# Patient Record
Sex: Male | Born: 1980 | Race: White | Hispanic: No | Marital: Single | Smoking: Current every day smoker
Health system: Southern US, Community
[De-identification: ages and names within clinical notes are randomized; demographics above are authoritative.]

## PROBLEM LIST (undated history)

## (undated) DIAGNOSIS — F909 Attention-deficit hyperactivity disorder, unspecified type: Secondary | ICD-10-CM

## (undated) DIAGNOSIS — R45851 Suicidal ideations: Secondary | ICD-10-CM

## (undated) DIAGNOSIS — F419 Anxiety disorder, unspecified: Secondary | ICD-10-CM

## (undated) HISTORY — DX: Suicidal ideations: R45.851

---

## 2002-07-26 ENCOUNTER — Emergency Department (HOSPITAL_COMMUNITY): Admission: EM | Admit: 2002-07-26 | Discharge: 2002-07-26 | Payer: Self-pay | Admitting: Emergency Medicine

## 2002-08-25 ENCOUNTER — Emergency Department (HOSPITAL_COMMUNITY): Admission: EM | Admit: 2002-08-25 | Discharge: 2002-08-25 | Payer: Self-pay | Admitting: *Deleted

## 2002-08-27 ENCOUNTER — Emergency Department (HOSPITAL_COMMUNITY): Admission: EM | Admit: 2002-08-27 | Discharge: 2002-08-27 | Payer: Self-pay | Admitting: Emergency Medicine

## 2002-10-02 ENCOUNTER — Emergency Department (HOSPITAL_COMMUNITY): Admission: EM | Admit: 2002-10-02 | Discharge: 2002-10-02 | Payer: Self-pay | Admitting: Emergency Medicine

## 2003-05-17 ENCOUNTER — Emergency Department (HOSPITAL_COMMUNITY): Admission: EM | Admit: 2003-05-17 | Discharge: 2003-05-17 | Payer: Self-pay

## 2004-06-10 ENCOUNTER — Emergency Department (HOSPITAL_COMMUNITY): Admission: EM | Admit: 2004-06-10 | Discharge: 2004-06-10 | Payer: Self-pay | Admitting: Emergency Medicine

## 2004-12-27 HISTORY — PX: SURGERY OF LIP: SUR1315

## 2004-12-28 ENCOUNTER — Emergency Department (HOSPITAL_COMMUNITY): Admission: EM | Admit: 2004-12-28 | Discharge: 2004-12-28 | Payer: Self-pay | Admitting: Emergency Medicine

## 2009-08-29 ENCOUNTER — Emergency Department (HOSPITAL_COMMUNITY): Admission: EM | Admit: 2009-08-29 | Discharge: 2009-08-29 | Payer: Self-pay | Admitting: Family Medicine

## 2010-10-29 ENCOUNTER — Emergency Department (HOSPITAL_COMMUNITY): Admission: EM | Admit: 2010-10-29 | Discharge: 2010-10-29 | Payer: Self-pay | Admitting: Emergency Medicine

## 2011-08-29 ENCOUNTER — Inpatient Hospital Stay (INDEPENDENT_AMBULATORY_CARE_PROVIDER_SITE_OTHER)
Admission: RE | Admit: 2011-08-29 | Discharge: 2011-08-29 | Disposition: A | Discharge status: Home / Self Care | Payer: Self-pay | Source: Ambulatory Visit | Attending: Emergency Medicine | Admitting: Emergency Medicine

## 2011-08-29 DIAGNOSIS — M545 Low back pain, unspecified: Secondary | ICD-10-CM

## 2011-09-08 ENCOUNTER — Inpatient Hospital Stay (INDEPENDENT_AMBULATORY_CARE_PROVIDER_SITE_OTHER)
Admission: RE | Admit: 2011-09-08 | Discharge: 2011-09-08 | Disposition: A | Discharge status: Home / Self Care | Payer: Self-pay | Source: Ambulatory Visit | Attending: Family Medicine | Admitting: Family Medicine

## 2011-09-08 DIAGNOSIS — S335XXA Sprain of ligaments of lumbar spine, initial encounter: Secondary | ICD-10-CM

## 2012-08-10 ENCOUNTER — Encounter (HOSPITAL_COMMUNITY): Payer: Self-pay | Admitting: *Deleted

## 2012-08-10 ENCOUNTER — Emergency Department (INDEPENDENT_AMBULATORY_CARE_PROVIDER_SITE_OTHER)
Admission: EM | Admit: 2012-08-10 | Discharge: 2012-08-10 | Disposition: A | Discharge status: Home / Self Care | Payer: Self-pay | Source: Home / Self Care | Attending: Emergency Medicine | Admitting: Emergency Medicine

## 2012-08-10 DIAGNOSIS — L0291 Cutaneous abscess, unspecified: Secondary | ICD-10-CM

## 2012-08-10 DIAGNOSIS — L0390 Cellulitis, unspecified: Secondary | ICD-10-CM

## 2012-08-10 MED ORDER — HYDROCODONE-ACETAMINOPHEN 5-325 MG PO TABS: ORAL_TABLET | ORAL | Status: AC

## 2012-08-10 MED ORDER — TRAMADOL HCL 50 MG PO TABS: 100.0000 mg | ORAL_TABLET | Freq: Three times a day (TID) | ORAL | Status: DC | PRN

## 2012-08-10 MED ORDER — SULFAMETHOXAZOLE-TMP DS 800-160 MG PO TABS: 2.0000 | ORAL_TABLET | Freq: Two times a day (BID) | ORAL | Status: AC

## 2012-08-10 NOTE — ED Notes (Signed)
Pt reports large boil on inner left thigh that is draining.

## 2012-08-10 NOTE — ED Provider Notes (Signed)
Chief Complaint  Patient presents with  . Abscess    History of Present Illness:    The patient is a 31 year old male who's had a 3 to four-day history of an abscess in his left groin. Today drained a little bit of serous fluid. He's had these before but none recently. No prior history of MRSA or diabetes. He denies fever or chills.  Review of Systems:  Other than noted above, the patient denies any of the following symptoms: Systemic:  No fever, chills or sweats. Skin:  No rash or itching.  PMFSH:  Past medical history, family history, social history, meds, and allergies were reviewed.  No history of diabetes or prior history of abscesses or MRSA.  Physical Exam:   Vital signs:  BP 130/81  Pulse 80  Temp 98.5 F (36.9 C) (Oral)  Resp 18  SpO2 98% Skin:  There was a 3 x 4 cm, indurated, raised, red, tender, a ovoid abscess in the left groin. There was a small central sinus and this was draining serous fluid.  Skin exam was otherwise normal.  No rash. Ext:  Distal pulses were full, patient has full ROM of all joints.    Procedure:  Verbal informed consent was obtained.  The patient was informed of the risks and benefits of the procedure and understands and accepts.  Identity of the patient was verified verbally and by wristband.   The abscess area described above was prepped with Betadine and alcohol and anesthetized with 5 mL of 2% Xylocaine with epinephrine.  Using a #11 scalpel blade, a singe straight incision was made into the area of fluctulence, yielding a small amount of prurulent drainage.  Routine cultures were obtained.  Blunt dissection was used to break up loculations and the resulting wound cavity was packed with 1/4 inch Iodoform gauze.  A sterile pressure dressing was applied.  Assessment:  The encounter diagnosis was Abscess.  Plan:   1.  The following meds were prescribed:   New Prescriptions   HYDROCODONE-ACETAMINOPHEN (NORCO/VICODIN) 5-325 MG PER TABLET    1 to 2  tabs every 4 to 6 hours as needed for pain.   SULFAMETHOXAZOLE-TRIMETHOPRIM (BACTRIM DS) 800-160 MG PER TABLET    Take 2 tablets by mouth 2 (two) times daily.   2.  The patient was instructed in symptomatic care and handouts were given. 3.  The patient was instructed to leave the dressing in place and return again in 48 hours for packing removal.   Reuben Likes, MD 08/10/12 2150

## 2012-08-14 ENCOUNTER — Telehealth (HOSPITAL_COMMUNITY): Payer: Self-pay | Admitting: *Deleted

## 2012-08-14 LAB — CULTURE, ROUTINE-ABSCESS
Gram Stain: NONE SEEN
Special Requests: NORMAL

## 2012-08-14 NOTE — ED Notes (Signed)
Pt. called for her lab results.  Pt. verified x 2 and given results. I told pt. She was adequately treated with the Bactrim DS.  I told her it was not MRSA.  She asked what that was and I explained.   Pt. said she did not fill her Rx. yet.  She asked if she needs to fill it? I said yes, staph can be a serious infection if not treated.  She asked if she needed to come back and I said only if its getting worse or not better when she finished her medication.  She said it is getting better. Vassie Moselle 08/14/2012

## 2013-02-01 ENCOUNTER — Emergency Department (HOSPITAL_COMMUNITY)
Admission: EM | Admit: 2013-02-01 | Discharge: 2013-02-01 | Disposition: A | Discharge status: Home / Self Care | Payer: Self-pay | Attending: Emergency Medicine | Admitting: Emergency Medicine

## 2013-02-01 ENCOUNTER — Encounter (HOSPITAL_COMMUNITY): Payer: Self-pay | Admitting: Cardiology

## 2013-02-01 ENCOUNTER — Emergency Department (HOSPITAL_COMMUNITY): Payer: Self-pay

## 2013-02-01 DIAGNOSIS — N281 Cyst of kidney, acquired: Secondary | ICD-10-CM | POA: Insufficient documentation

## 2013-02-01 DIAGNOSIS — R1900 Intra-abdominal and pelvic swelling, mass and lump, unspecified site: Secondary | ICD-10-CM

## 2013-02-01 DIAGNOSIS — F17200 Nicotine dependence, unspecified, uncomplicated: Secondary | ICD-10-CM | POA: Insufficient documentation

## 2013-02-01 DIAGNOSIS — R1909 Other intra-abdominal and pelvic swelling, mass and lump: Secondary | ICD-10-CM | POA: Insufficient documentation

## 2013-02-01 DIAGNOSIS — R209 Unspecified disturbances of skin sensation: Secondary | ICD-10-CM | POA: Insufficient documentation

## 2013-02-01 LAB — CBC WITH DIFFERENTIAL/PLATELET
Hemoglobin: 13.4 g/dL (ref 13.0–17.0)
Lymphocytes Relative: 50 % — ABNORMAL HIGH (ref 12–46)
Lymphs Abs: 3.4 10*3/uL (ref 0.7–4.0)
MCH: 29.5 pg (ref 26.0–34.0)
Monocytes Relative: 5 % (ref 3–12)
Neutrophils Relative %: 44 % (ref 43–77)
Platelets: 265 10*3/uL (ref 150–400)
RBC: 4.55 MIL/uL (ref 4.22–5.81)
WBC: 6.8 10*3/uL (ref 4.0–10.5)

## 2013-02-01 LAB — POCT I-STAT, CHEM 8
BUN: 15 mg/dL (ref 6–23)
Chloride: 107 mEq/L (ref 96–112)
Creatinine, Ser: 0.9 mg/dL (ref 0.50–1.35)
Glucose, Bld: 86 mg/dL (ref 70–99)
Potassium: 4.2 mEq/L (ref 3.5–5.1)
Sodium: 141 mEq/L (ref 135–145)

## 2013-02-01 MED ORDER — IOHEXOL 300 MG/ML  SOLN
100.0000 mL | Freq: Once | INTRAMUSCULAR | Status: AC | PRN
  Administered 2013-02-01: 100 mL via INTRAVENOUS

## 2013-02-01 MED ORDER — MORPHINE SULFATE 4 MG/ML IJ SOLN
4.0000 mg | Freq: Once | INTRAMUSCULAR | Status: AC
  Administered 2013-02-01: 4 mg via INTRAVENOUS
  Filled 2013-02-01: qty 1

## 2013-02-01 MED ORDER — OXYCODONE-ACETAMINOPHEN 5-325 MG PO TABS: 2.0000 | ORAL_TABLET | Freq: Once | ORAL | Status: AC

## 2013-02-01 MED ORDER — NAPROXEN 500 MG PO TABS: 500.0000 mg | ORAL_TABLET | Freq: Two times a day (BID) | ORAL | Status: DC

## 2013-02-01 MED ORDER — OXYCODONE-ACETAMINOPHEN 5-325 MG PO TABS: 1.0000 | ORAL_TABLET | ORAL | Status: DC | PRN

## 2013-02-01 MED ORDER — OXYCODONE-ACETAMINOPHEN 5-325 MG PO TABS
2.0000 | ORAL_TABLET | Freq: Once | ORAL | Status: AC
  Administered 2013-02-01: 2 via ORAL
  Filled 2013-02-01: qty 2

## 2013-02-01 MED ORDER — IOHEXOL 300 MG/ML  SOLN
25.0000 mL | INTRAMUSCULAR | Status: AC
  Administered 2013-02-01 (×2): 25 mL via ORAL

## 2013-02-01 NOTE — ED Notes (Signed)
CT notified of pt finishing contrast. 

## 2013-02-01 NOTE — ED Notes (Signed)
Pt returned from CT °

## 2013-02-01 NOTE — ED Notes (Signed)
Pt reports lumps to the left side of his back that having been present for the past couple of years but now is having pain and numbness down into his left side. Never been seen for this pain prior to today.

## 2013-02-01 NOTE — ED Notes (Signed)
Pt given discharge paperwork; pt verbalized understanding of discharge and f/u; no additional questions by pt; e-signature obtained; 

## 2013-02-01 NOTE — ED Provider Notes (Signed)
History     CSN: [id number]  Arrival date & time 02/01/13  1238   First MD Initiated Contact with Patient 02/01/13 1420      Chief Complaint  Patient presents with  . Flank Pain    (Consider location/radiation/quality/duration/timing/severity/associated sxs/prior treatment) HPI Comments: 32 year old male presents with a left-sided upper flank mass which has been present for approximately 10 years. He states that in the past he has just dealt with it, he has not been evaluated for the problem but it seems to have gotten worse in the last couple of days. He admits to having a greater than 60 pound weight loss over the last year which is unintentional but no other significant complaints.  When asked the patient admits that he has been having intermittent numbness in his genitalia as well as some difficulty sensation of his legs. He also complains of numbness of his penis when having sex. He denies a history of tuberculosis though he was incarcerated in the are of 2006. He denies night sweats or fevers  Patient is a 32 y.o. male presenting with flank pain. The history is provided by the patient.  Flank Pain    History reviewed. No pertinent past medical history.  History reviewed. No pertinent past surgical history.  History reviewed. No pertinent family history.  History  Substance Use Topics  . Smoking status: Current Every Day Smoker -- 0.5 packs/day    Types: Cigarettes  . Smokeless tobacco: Not on file  . Alcohol Use: No      Review of Systems  Genitourinary: Positive for flank pain.  All other systems reviewed and are negative.    Allergies  Review of patient's allergies indicates no known allergies.  Home Medications   Current Outpatient Rx  Name  Route  Sig  Dispense  Refill  . IBUPROFEN 200 MG PO TABS   Oral   Take 400 mg by mouth every 8 (eight) hours as needed. For pain         . NAPROXEN 500 MG PO TABS   Oral   Take 1 tablet (500 mg total) by mouth 2  (two) times daily with a meal.   30 tablet   0   . OXYCODONE-ACETAMINOPHEN 5-325 MG PO TABS   Oral   Take 1 tablet by mouth every 4 (four) hours as needed for pain.   20 tablet   0     BP 130/71  Pulse 66  Temp 98.3 F (36.8 C) (Oral)  Resp 18  SpO2 100%  Physical Exam  Nursing note and vitals reviewed. Constitutional: He appears well-developed and well-nourished. No distress.  HENT:  Head: Normocephalic and atraumatic.  Mouth/Throat: Oropharynx is clear and moist. No oropharyngeal exudate.  Eyes: Conjunctivae normal and EOM are normal. Pupils are equal, round, and reactive to light. Right eye exhibits no discharge. Left eye exhibits no discharge. No scleral icterus.  Neck: Normal range of motion. Neck supple. No JVD present. No thyromegaly present.  Cardiovascular: Normal rate, regular rhythm, normal heart sounds and intact distal pulses.  Exam reveals no gallop and no friction rub.   No murmur heard. Pulmonary/Chest: Effort normal and breath sounds normal. No respiratory distress. He has no wheezes. He has no rales.  Abdominal: Soft. Bowel sounds are normal. He exhibits no distension and no mass. There is no tenderness.  Musculoskeletal: Normal range of motion. He exhibits no edema and no tenderness.  Lymphadenopathy:    He has no cervical adenopathy.  Neurological: He  is alert. Coordination normal.  Skin: Skin is warm and dry. No rash noted. No erythema.       There is tenderness to palpation with a soft tissue mass in the left side and flank which is approximately 5 cm x 3 cm in a football shape. There is no erythema, no redness, no drainage, no fluctuance, no induration, it is tender to palpation.  It is mobile and rubbery in texture  Psychiatric: He has a normal mood and affect. His behavior is normal.    ED Course  Procedures (including critical care time)  Labs Reviewed  CBC WITH DIFFERENTIAL - Abnormal; Notable for the following:    HCT 38.5 (*)      Lymphocytes Relative 50 (*)     All other components within normal limits  POCT I-STAT, CHEM 8 - Abnormal; Notable for the following:    Calcium, Ion 1.24 (*)     All other components within normal limits   Ct Abdomen Pelvis W Contrast  02/01/2013  *RADIOLOGY REPORT*  Clinical Data: Left flank pain, weight loss  CT ABDOMEN AND PELVIS WITH CONTRAST  Technique:  Multidetector CT imaging of the abdomen and pelvis was performed following the standard protocol during bolus administration of intravenous contrast.  Contrast: OMNIPAQUE IOHEXOL 300 MG/ML  SOLN  Comparison: None.  Findings: Lung bases are clear.  Liver, pancreas, and adrenal glands are within normal limits.  Spleen is normal in size, measuring 9.9 cm in craniocaudal dimension.  Gallbladder is underdistended.  No intrahepatic or extrahepatic ductal dilatation.  12 x 12 mm left upper pole renal cyst (series 6/image 27).  12 x 9 mm posterior interpolar left renal cyst (series 6/image 38). Additional 8 mm suspected anterior interpolar left renal cyst (series 6/image 41).  Right kidney is within normal limits.  No hydronephrosis.  No evidence of bowel obstruction.  Normal appendix.  No evidence of abdominal aortic aneurysm.  Retroaortic left renal vein.  No suspicious abdominopelvic lymphadenopathy.  Prostate is unremarkable.  Bladder is within normal limits.  Tiny fat-containing right inguinal hernia.  Visualized osseous structures are within normal limits.  IMPRESSION: Small left renal cysts measuring up to 12 mm, benign.  Otherwise normal CT abdomen/pelvis.   Original Report Authenticated By: Charline Bills, M.D.      1. Flank mass   2. Renal cyst       MDM  The patient has a stable exam, he has been able to ambulate without difficulty, vital signs remain normal, laboratory data is normal including a CBC and a metabolic panel. The CT scan was read as negative for any acute findings including no masses. He does have some renal cysts of  which the patient was made aware and will followup. Followup phone numbers given including general surgery   Discharge Prescriptions include:   Naprosyn  Percocet        Vida Roller, MD 02/01/13 [phone]

## 2013-02-01 NOTE — ED Notes (Signed)
Pt reports he has had lumps present for about 13 years; states pain started about 8 years ago and comes and goes; states he has been having some shortness of breath but none currently;

## 2013-05-07 ENCOUNTER — Encounter (HOSPITAL_COMMUNITY): Payer: Self-pay | Admitting: *Deleted

## 2013-05-07 ENCOUNTER — Emergency Department (HOSPITAL_COMMUNITY): Payer: Self-pay

## 2013-05-07 ENCOUNTER — Emergency Department (HOSPITAL_COMMUNITY)
Admission: EM | Admit: 2013-05-07 | Discharge: 2013-05-07 | Disposition: A | Discharge status: Home / Self Care | Payer: Self-pay | Attending: Emergency Medicine | Admitting: Emergency Medicine

## 2013-05-07 DIAGNOSIS — F17200 Nicotine dependence, unspecified, uncomplicated: Secondary | ICD-10-CM | POA: Insufficient documentation

## 2013-05-07 DIAGNOSIS — D179 Benign lipomatous neoplasm, unspecified: Secondary | ICD-10-CM | POA: Insufficient documentation

## 2013-05-07 LAB — URINALYSIS, ROUTINE W REFLEX MICROSCOPIC
Bilirubin Urine: NEGATIVE
Ketones, ur: NEGATIVE mg/dL
Nitrite: NEGATIVE
Specific Gravity, Urine: 1.019 (ref 1.005–1.030)
Urobilinogen, UA: 0.2 mg/dL (ref 0.0–1.0)

## 2013-05-07 LAB — CBC WITH DIFFERENTIAL/PLATELET
Eosinophils Absolute: 0.1 10*3/uL (ref 0.0–0.7)
Eosinophils Relative: 1 % (ref 0–5)
HCT: 42.2 % (ref 39.0–52.0)
Hemoglobin: 14.8 g/dL (ref 13.0–17.0)
Lymphs Abs: 2.2 10*3/uL (ref 0.7–4.0)
MCH: 29.7 pg (ref 26.0–34.0)
MCHC: 35.1 g/dL (ref 30.0–36.0)
MCV: 84.6 fL (ref 78.0–100.0)
Monocytes Absolute: 0.4 10*3/uL (ref 0.1–1.0)
Monocytes Relative: 5 % (ref 3–12)
Neutrophils Relative %: 64 % (ref 43–77)
RBC: 4.99 MIL/uL (ref 4.22–5.81)

## 2013-05-07 LAB — COMPREHENSIVE METABOLIC PANEL
Alkaline Phosphatase: 77 U/L (ref 39–117)
BUN: 18 mg/dL (ref 6–23)
Creatinine, Ser: 0.75 mg/dL (ref 0.50–1.35)
GFR calc Af Amer: >90 mL/min (ref >90)
Glucose, Bld: 107 mg/dL — ABNORMAL HIGH (ref 70–99)
Potassium: 4 mEq/L (ref 3.5–5.1)
Total Protein: 8.3 g/dL (ref 6.0–8.3)

## 2013-05-07 LAB — LIPASE, BLOOD: Lipase: 39 U/L (ref 11–59)

## 2013-05-07 MED ORDER — OXYCODONE-ACETAMINOPHEN 5-325 MG PO TABS: 2.0000 | ORAL_TABLET | ORAL | Status: DC | PRN

## 2013-05-07 MED ORDER — OXYCODONE-ACETAMINOPHEN 5-325 MG PO TABS
1.0000 | ORAL_TABLET | Freq: Once | ORAL | Status: AC
  Administered 2013-05-07: 1 via ORAL
  Filled 2013-05-07: qty 1

## 2013-05-07 MED ORDER — IBUPROFEN 800 MG PO TABS
800.0000 mg | ORAL_TABLET | Freq: Once | ORAL | Status: AC
  Administered 2013-05-07: 800 mg via ORAL
  Filled 2013-05-07: qty 1

## 2013-05-07 MED ORDER — IBUPROFEN 800 MG PO TABS: 800.0000 mg | ORAL_TABLET | Freq: Three times a day (TID) | ORAL | Status: DC

## 2013-05-07 NOTE — ED Provider Notes (Signed)
History     CSN: [id number]  Arrival date & time 05/07/13  8295   First MD Initiated Contact with Patient 05/07/13 1052      Chief Complaint  Patient presents with  . Back Pain    (Consider location/radiation/quality/duration/timing/severity/associated sxs/prior treatment) The history is provided by the patient.  Candelario MANLEY FASON is a 32 y.o. male here presenting with possible mass. She noticed a mobile mass for several years on the left side of his chest. For the last several months he noticed another lump along the left side of his chest. Denies any chest pain or shortness of breath. Over the last few days, his pain is worse when he coughs. It sometimes radiate around to his abdomen. Vomited several days ago that resolved. No urinary symptoms or constipation or diarrhea. He said he was told to follow up with a surgeon but he lost the number. He still smokes but is not loosing weight.    History reviewed. No pertinent past medical history.  History reviewed. No pertinent past surgical history.  No family history on file.  History  Substance Use Topics  . Smoking status: Current Every Day Smoker -- 0.50 packs/day    Types: Cigarettes  . Smokeless tobacco: Not on file  . Alcohol Use: No      Review of Systems  Gastrointestinal: Positive for abdominal pain.  Musculoskeletal: Positive for back pain.  All other systems reviewed and are negative.    Allergies  Review of patient's allergies indicates no known allergies.  Home Medications   Current Outpatient Rx  Name  Route  Sig  Dispense  Refill  . ibuprofen (ADVIL,MOTRIN) 200 MG tablet   Oral   Take 400 mg by mouth every 8 (eight) hours as needed for pain. For pain         . ibuprofen (ADVIL,MOTRIN) 800 MG tablet   Oral   Take 1 tablet (800 mg total) by mouth 3 (three) times daily.   21 tablet   0   . oxyCODONE-acetaminophen (PERCOCET) 5-325 MG per tablet   Oral   Take 2 tablets by mouth every 4 (four)  hours as needed for pain.   10 tablet   0     BP 136/91  Pulse 92  Temp(Src) 98.4 F (36.9 C) (Oral)  Resp 18  SpO2 99%  Physical Exam  Nursing note and vitals reviewed. Constitutional: He is oriented to person, place, and time. He appears well-developed and well-nourished.  Anxious, overweight, pleasant   HENT:  Head: Normocephalic.  Mouth/Throat: Oropharynx is clear and moist.  Eyes: Conjunctivae are normal. Pupils are equal, round, and reactive to light.  Neck: Normal range of motion. Neck supple.  Cardiovascular: Normal rate, regular rhythm and normal heart sounds.   Pulmonary/Chest: Effort normal and breath sounds normal. No respiratory distress. He has no wheezes. He has no rales.  Two small mobile mass around L 8th or 9th rib. No retractions. No bruising or signs of trauma.   Abdominal: Soft. Bowel sounds are normal. He exhibits no distension. There is no tenderness. There is no rebound and no guarding.  Musculoskeletal: Normal range of motion.  Neurological: He is alert and oriented to person, place, and time.  Skin: Skin is warm and dry.  Psychiatric: He has a normal mood and affect. His behavior is normal. Judgment and thought content normal.    ED Course  Procedures (including critical care time)  Labs Reviewed  COMPREHENSIVE METABOLIC PANEL - Abnormal; Notable for  the following:    Glucose, Bld 107 (*)    All other components within normal limits  CBC WITH DIFFERENTIAL  LIPASE, BLOOD  URINALYSIS, ROUTINE W REFLEX MICROSCOPIC   Dg Chest 2 View  05/07/2013  *RADIOLOGY REPORT*  Clinical Data: Left side not.  CHEST - 2 VIEW  Comparison: None.  Findings: Trachea is midline.  Heart size normal.  Lungs are clear but appear slightly hyperexpanded.  No pleural fluid.  IMPRESSION: No acute findings.   Original Report Authenticated By: Leanna Battles, M.D.      1. Lipoma       MDM  ASLAN HIMES is a 32 y.o. male here with mass. I think its likely lipoma on  his chest wall. CXR obtained to r/o lung mass and showed no mass. Labs unremarkable. I gave him [place] [facility] f/u and gave him short course of motrin and prn percocet.          Richardean Canal, MD 05/07/13 1257

## 2013-05-07 NOTE — ED Notes (Signed)
Pt is here with lower back pain that he has had for a while and denies incontinence of bowel or bladder and has new lump to mid lateral left back that makes him vomit

## 2013-05-07 NOTE — ED Notes (Signed)
Pt states pain radiates to abdomen

## 2013-05-07 NOTE — ED Notes (Signed)
Patient C/O having a lump on his left side. States that he was seen in February for the same thing.   It is 4 cm X 2 cm and is painful to touch.  States that he has another lump starting on his left lateral, lower thorax.  He describes the pain as a cramping pain. States that the lumps are popping up all over his body.

## 2013-05-11 ENCOUNTER — Ambulatory Visit (INDEPENDENT_AMBULATORY_CARE_PROVIDER_SITE_OTHER): Payer: BC Managed Care – PPO | Admitting: Surgery

## 2013-05-11 ENCOUNTER — Encounter (INDEPENDENT_AMBULATORY_CARE_PROVIDER_SITE_OTHER): Payer: Self-pay | Admitting: Surgery

## 2013-05-11 VITALS — BP 119/72 | HR 64 | Temp 97.7°F | Resp 14 | Ht 73.0 in | Wt 256.4 lb

## 2013-05-11 DIAGNOSIS — D1739 Benign lipomatous neoplasm of skin and subcutaneous tissue of other sites: Secondary | ICD-10-CM

## 2013-05-11 DIAGNOSIS — E7889 Other lipoprotein metabolism disorders: Secondary | ICD-10-CM

## 2013-05-11 DIAGNOSIS — E882 Lipomatosis, not elsewhere classified: Secondary | ICD-10-CM | POA: Insufficient documentation

## 2013-05-11 DIAGNOSIS — D171 Benign lipomatous neoplasm of skin and subcutaneous tissue of trunk: Secondary | ICD-10-CM | POA: Insufficient documentation

## 2013-05-11 MED ORDER — TRAMADOL HCL 50 MG PO TABS: 50.0000 mg | ORAL_TABLET | Freq: Four times a day (QID) | ORAL | Status: DC | PRN

## 2013-05-11 NOTE — Patient Instructions (Addendum)
We will schedule surgery to remove the larger lipoma from your left flank and plan to remove the smaller ones just above it as well.

## 2013-05-11 NOTE — Progress Notes (Signed)
Andrew Tate DOB: 04-09-1981 MRN: [id number]                                                                                      DATE: 05/11/2013  PCP: Catalina Pizza, MD Referring Provider: Catalina Pizza, MD  IMPRESSION:  Lipomatosis, with several left flank area symptomatic, painful, lipomas Chest and abdominal pain, which I told him I don't think are necessarily related to lipomas  PLAN:   we will plan to remove the most symptomatic lipomas which is a possibly 5-6 cm lipoma of the left flank and two smaller ones that are somewhat above it.since he is having a fair amount of pain I will give him some Ultram to see if this will help.                 CC:  Chief Complaint  Patient presents with  . New Evaluation    eval Lt side flank lipoma    HPI:  Andrew Tate is a 32 y.o.  male who presents for evaluation of multiple symptomatic lipomas. 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 His action emergency room because he was having pain it has gotten worse from a large one in the left flank area. He also has some abdominal symptoms which he relates to this. He has 2 smaller ones above that are tender. He has multiple other lipomas across his abdomen arms and legs. Those are not symptomatic currently.  PMH:  has no past medical history on file.  PSH:   has past surgical history that includes Surgery of lip (2006).  ALLERGIES:  No Known Allergies  MEDICATIONS: Current outpatient prescriptions:ibuprofen (ADVIL,MOTRIN) 200 MG tablet, Take 400 mg by mouth every 8 (eight) hours as needed for pain. For pain, Disp: , Rfl: ;  ibuprofen (ADVIL,MOTRIN) 800 MG tablet, Take 1 tablet (800 mg total) by mouth 3 (three) times daily., Disp: 21 tablet, Rfl: 0;  oxyCODONE-acetaminophen (PERCOCET) 5-325 MG per tablet, Take 2 tablets by mouth every 4 (four) hours as needed for pain., Disp: 10 tablet, Rfl: 0  ROS: He has filled out our 12 point review of systems and it is negative . EXAM:   VITAL  SIGNS: BP 119/72  Pulse 64  Temp(Src) 97.7 F (36.5 C) (Temporal)  Resp 14  Ht 6\' 1"  (1.854 m)  Wt 256 lb 6.4 oz (116.302 kg)  BMI 33.84 kg/m2  SpO2 98%  GENERAL:  The patient is alert, oriented, and generally healthy-appearing, NAD. Mood and affect are normal.  HEENT:  The head is normocephalic, the eyes nonicteric, the pupils were round regular and equal. EOMs are normal. Pharynx normal. Dentition good.  NECK:  The neck is supple and there are no masses or thyromegaly.  LUNGS:  Normal respirations and clear to auscultation.  HEART:  Regular rhythm, with no murmurs rubs or gallops. Pulses are intact carotid dorsalis pedis and posterior tibial. No significant varicosities are noted.  ABDOMEN:  Soft, flat, and nontender. No masses or organomegaly is noted. No hernias are noted. Bowel sounds are normal.  SKIN: He has scattered lipomas throughout his abdomen arms and legs. In the left flank  area is a firm lipomatous feeling mass that is about 5-6 cm and somewhat tender. There 2 smaller ones around this somewhat superior to measure 1-2 cm are of little bit tender. The other lipomas seem to be relatively asymptomatic.   EXTREMITIES:  Good range of motion, no edema.   DATA REVIEWED:  I looked over the notes from his recent visit to the emergency department    Tushar Enns J 05/11/2013  CC: Catalina Pizza, MD, Catalina Pizza, MD

## 2015-08-25 ENCOUNTER — Emergency Department (HOSPITAL_COMMUNITY)
Admission: EM | Admit: 2015-08-25 | Discharge: 2015-08-25 | Disposition: A | Discharge status: Home / Self Care | Payer: Self-pay | Attending: Emergency Medicine | Admitting: Emergency Medicine

## 2015-08-25 ENCOUNTER — Encounter (HOSPITAL_COMMUNITY): Payer: Self-pay | Admitting: Emergency Medicine

## 2015-08-25 DIAGNOSIS — Z791 Long term (current) use of non-steroidal anti-inflammatories (NSAID): Secondary | ICD-10-CM | POA: Insufficient documentation

## 2015-08-25 DIAGNOSIS — F419 Anxiety disorder, unspecified: Secondary | ICD-10-CM | POA: Insufficient documentation

## 2015-08-25 DIAGNOSIS — M5442 Lumbago with sciatica, left side: Secondary | ICD-10-CM | POA: Insufficient documentation

## 2015-08-25 DIAGNOSIS — Z720 Tobacco use: Secondary | ICD-10-CM | POA: Insufficient documentation

## 2015-08-25 HISTORY — DX: Attention-deficit hyperactivity disorder, unspecified type: F90.9

## 2015-08-25 HISTORY — DX: Anxiety disorder, unspecified: F41.9

## 2015-08-25 MED ORDER — HYDROCODONE-ACETAMINOPHEN 5-325 MG PO TABS: 1.0000 | ORAL_TABLET | Freq: Four times a day (QID) | ORAL | Status: DC | PRN

## 2015-08-25 MED ORDER — METHOCARBAMOL 500 MG PO TABS: 500.0000 mg | ORAL_TABLET | Freq: Two times a day (BID) | ORAL | Status: DC

## 2015-08-25 MED ORDER — NAPROXEN 500 MG PO TABS: 500.0000 mg | ORAL_TABLET | Freq: Two times a day (BID) | ORAL | Status: DC

## 2015-08-25 NOTE — ED Notes (Signed)
Pt called x 1 for no answer.

## 2015-08-25 NOTE — ED Provider Notes (Signed)
CSN: [id number]     Arrival date & time 08/25/15  1700 History  This chart was scribed for Hyman Bible, PA-C, working with Quintella Reichert, MD by Steva Colder, ED Scribe. The patient was seen in room WTR7/WTR7 at 7:48 PM.    Chief Complaint  Patient presents with  . Back Pain    x2 weeks, no injury      The history is provided by the patient. No language interpreter was used.    HPI Comments: Andrew Tate is a 34 y.o. male who presents to the Emergency Department complaining of constant left lower back pain onset 2 weeks. He reports that when the pain began his back was tight and it has worsened since then. He reports that the back pain does radiate to his down his left leg and into his buttocks area. 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 Pt reports that his back pain is worsened with certain positions. He states that he has tried ibuprofen which helped at first but now it doesn't for his symptoms. Pt denies bowel/bladder incontinence, fever, chills, numbness, tingling, and any other symptoms. Denies hx of IV drug use or CA. He reports that tramadol makes him extremely sick.    Past Medical History  Diagnosis Date  . Anxiety   . ADHD (attention deficit hyperactivity disorder)    Past Surgical History  Procedure Laterality Date  . Surgery of lip  2006    spit gland removal   No family history on file. Social History  Substance Use Topics  . Smoking status: Current Every Day Smoker -- 0.50 packs/day    Types: Cigarettes  . Smokeless tobacco: None  . Alcohol Use: Yes     Comment: very rare    Review of Systems  Constitutional: Negative for fever and chills.  Gastrointestinal:       No bowel incontinence  Genitourinary:       No bladder incontinence  Musculoskeletal: Positive for back pain. Negative for gait problem.  Neurological: Negative for numbness.       No tingling      Allergies  Review of patient's allergies indicates no  known allergies.  Home Medications   Prior to Admission medications   Medication Sig Start Date End Date Taking? Authorizing Provider  ibuprofen (ADVIL,MOTRIN) 200 MG tablet Take 400 mg by mouth every 8 (eight) hours as needed for pain. For pain    Historical Provider, MD  ibuprofen (ADVIL,MOTRIN) 800 MG tablet Take 1 tablet (800 mg total) by mouth 3 (three) times daily. 05/07/13   Wandra Arthurs, MD  oxyCODONE-acetaminophen (PERCOCET) 5-325 MG per tablet Take 2 tablets by mouth every 4 (four) hours as needed for pain. 05/07/13   Wandra Arthurs, MD  traMADol (ULTRAM) 50 MG tablet Take 1 tablet (50 mg total) by mouth every 6 (six) hours as needed for pain. 05/11/13   Neldon Mc, MD   BP 120/70 mmHg  Pulse 80  Temp(Src) 97.8 F (36.6 C) (Oral)  Resp 20  SpO2 99% Physical Exam  Constitutional: He is oriented to person, place, and time. He appears well-developed and well-nourished. No distress.  HENT:  Head: Normocephalic and atraumatic.  Eyes: EOM are normal.  Neck: Neck supple. No tracheal deviation present.  Cardiovascular: Normal rate.   Pulmonary/Chest: Effort normal. No respiratory distress.  Musculoskeletal: Normal range of motion.       Cervical back: Normal.       Thoracic back:  Normal.       Lumbar back: Normal.  No TTP of cervical, thoracic, or lumbar spine. No paraspinal TTP. Muscle strength 5/5 on lower extremities bilaterally.   Neurological: He is alert and oriented to person, place, and time. No sensory deficit.  Reflex Scores:      Patellar reflexes are 2+ on the right side and 2+ on the left side. Distal sensation of all toes are intact.  Skin: Skin is warm and dry.  Psychiatric: He has a normal mood and affect. His behavior is normal.  Nursing note and vitals reviewed.   ED Course  Procedures (including critical care time) DIAGNOSTIC STUDIES: Oxygen Saturation is 99% on RA, nl by my interpretation.    COORDINATION OF CARE: 7:55 PM Discussed treatment plan with  pt at bedside which includes anti-inflammatory Rx, muscle relaxer Rx, alternate heat or ice, and pt agreed to plan.   Labs Review Labs Reviewed - No data to display  Imaging Review No results found. I have personally reviewed and evaluated these images and lab results as part of my medical decision-making.   EKG Interpretation None      MDM   Final diagnoses:  None   Patient with back pain.  No neurological deficits and normal neuro exam.  Patient can walk but states is painful.  No loss of bowel or bladder control.  No concern for cauda equina.  No fever, night sweats, weight loss, h/o cancer, IVDU.  Patient stable for discharge.  Return precautions given.     I personally performed the services described in this documentation, which was scribed in my presence. The recorded information has been reviewed and is accurate.    Hyman Bible, PA-C 08/25/15 2219  Quintella Reichert, MD 08/26/15 [phone]

## 2015-08-25 NOTE — Discharge Instructions (Signed)
Followup with orthopedics if symptoms continue. Use conservative methods at home including heat therapy and cold therapy as we discussed. More information on cold therapy is listed below.  It is not recommended to use heat treatment directly after an acute injury.  Take pain medication and muscle relaxer as needed for pain.  Do not drive or operate heavy machinery for 4-6 hours after taking medications.  SEEK IMMEDIATE MEDICAL ATTENTION IF: New numbness, tingling, weakness, or problem with the use of your arms or legs.  Severe back pain not relieved with medications.  Change in bowel or bladder control.  Increasing pain in any areas of the body (such as chest or abdominal pain).  Shortness of breath, dizziness or fainting.  Nausea (feeling sick to your stomach), vomiting, fever, or sweats.  COLD THERAPY DIRECTIONS:  Ice or gel packs can be used to reduce both pain and swelling. Ice is the most helpful within the first 24 to 48 hours after an injury or flareup from overusing a muscle or joint.  Ice is effective, has very few side effects, and is safe for most people to use.   If you expose your skin to cold temperatures for too long or without the proper protection, you can damage your skin or nerves. Watch for signs of skin damage due to cold.   HOME CARE INSTRUCTIONS  Follow these tips to use ice and cold packs safely.  Place a dry or damp towel between the ice and skin. A damp towel will cool the skin more quickly, so you may need to shorten the time that the ice is used.  For a more rapid response, add gentle compression to the ice.  Ice for no more than 10 to 20 minutes at a time. The bonier the area you are icing, the less time it will take to get the benefits of ice.  Check your skin after 5 minutes to make sure there are no signs of a poor response to cold or skin damage.  Rest 20 minutes or more in between uses.  Once your skin is numb, you can end your treatment. You can test numbness  by very lightly touching your skin. The touch should be so light that you do not see the skin dimple from the pressure of your fingertip. When using ice, most people will feel these normal sensations in this order: cold, burning, aching, and numbness.  Do not use ice on someone who cannot communicate their responses to pain, such as small children or people with dementia.   HOW TO MAKE AN ICE PACK  To make an ice pack, do one of the following:  Place crushed ice or a bag of frozen vegetables in a sealable plastic bag. Squeeze out the excess air. Place this bag inside another plastic bag. Slide the bag into a pillowcase or place a damp towel between your skin and the bag.  Mix 3 parts water with 1 part rubbing alcohol. Freeze the mixture in a sealable plastic bag. When you remove the mixture from the freezer, it will be slushy. Squeeze out the excess air. Place this bag inside another plastic bag. Slide the bag into a pillowcase or place a damp towel between your skin and the bag.   SEEK MEDICAL CARE IF:  You develop white spots on your skin. This may give the skin a blotchy (mottled) appearance.  Your skin turns blue or pale.  Your skin becomes waxy or hard.  Your swelling gets worse.  MAKE SURE YOU:  Understand these instructions.  Will watch your condition.  Will get help right away if you are not doing well or get worse.

## 2015-08-25 NOTE — ED Notes (Addendum)
Pt A+Ox4, reports c/o L low back pain, x2 weeks, shooting into LLE, constant.  Denies injury.  "I felt it tighten up on me and it's just gotten worse".  6/10.  Pt denies n/t to extremities, denies b/b changes or complaints.  Ambulatory with steady gait.  Speaking full/clear sentences, rr even/un-lab.  Skin PWD.  NAD.

## 2015-12-01 ENCOUNTER — Emergency Department (HOSPITAL_COMMUNITY): Payer: Self-pay

## 2015-12-01 ENCOUNTER — Emergency Department (HOSPITAL_COMMUNITY)
Admission: EM | Admit: 2015-12-01 | Discharge: 2015-12-01 | Discharge status: Against Medical Advice | Payer: Self-pay | Attending: Emergency Medicine | Admitting: Emergency Medicine

## 2015-12-01 ENCOUNTER — Encounter (HOSPITAL_COMMUNITY): Payer: Self-pay | Admitting: *Deleted

## 2015-12-01 DIAGNOSIS — R509 Fever, unspecified: Secondary | ICD-10-CM | POA: Insufficient documentation

## 2015-12-01 DIAGNOSIS — F17210 Nicotine dependence, cigarettes, uncomplicated: Secondary | ICD-10-CM | POA: Insufficient documentation

## 2015-12-01 DIAGNOSIS — R05 Cough: Secondary | ICD-10-CM | POA: Insufficient documentation

## 2015-12-01 LAB — COMPREHENSIVE METABOLIC PANEL
ALT: 29 U/L (ref 17–63)
AST: 25 U/L (ref 15–41)
Albumin: 3.9 g/dL (ref 3.5–5.0)
Alkaline Phosphatase: 65 U/L (ref 38–126)
Anion gap: 7 (ref 5–15)
BILIRUBIN TOTAL: 0.6 mg/dL (ref 0.3–1.2)
BUN: 11 mg/dL (ref 6–20)
CALCIUM: 9.3 mg/dL (ref 8.9–10.3)
CHLORIDE: 102 mmol/L (ref 101–111)
CO2: 26 mmol/L (ref 22–32)
CREATININE: 0.81 mg/dL (ref 0.61–1.24)
Glucose, Bld: 108 mg/dL — ABNORMAL HIGH (ref 65–99)
Potassium: 3.8 mmol/L (ref 3.5–5.1)
Sodium: 135 mmol/L (ref 135–145)
TOTAL PROTEIN: 7.8 g/dL (ref 6.5–8.1)

## 2015-12-01 LAB — LIPASE, BLOOD: LIPASE: 22 U/L (ref 11–51)

## 2015-12-01 LAB — CBC
HCT: 40.3 % (ref 39.0–52.0)
Hemoglobin: 13.3 g/dL (ref 13.0–17.0)
MCH: 29 pg (ref 26.0–34.0)
MCHC: 33 g/dL (ref 30.0–36.0)
MCV: 88 fL (ref 78.0–100.0)
PLATELETS: 205 10*3/uL (ref 150–400)
RBC: 4.58 MIL/uL (ref 4.22–5.81)
RDW: 13.4 % (ref 11.5–15.5)
WBC: 8.1 10*3/uL (ref 4.0–10.5)

## 2015-12-01 NOTE — ED Notes (Signed)
Pt reports fever,  productive cough with green sputum, headache, bodyaches and n/v x 2 days. Denies diarrhea.

## 2015-12-01 NOTE — ED Notes (Signed)
Pt.stated he had to leave due to wait time .was to long and left.

## 2015-12-03 ENCOUNTER — Encounter (HOSPITAL_COMMUNITY): Payer: Self-pay

## 2015-12-03 ENCOUNTER — Inpatient Hospital Stay (HOSPITAL_COMMUNITY)
Admission: EM | Admit: 2015-12-03 | Discharge: 2015-12-04 | DRG: 195 | Disposition: A | Discharge status: Home / Self Care | Payer: Self-pay | Attending: Internal Medicine | Admitting: Internal Medicine

## 2015-12-03 ENCOUNTER — Emergency Department (HOSPITAL_COMMUNITY): Payer: Self-pay

## 2015-12-03 DIAGNOSIS — R0602 Shortness of breath: Secondary | ICD-10-CM

## 2015-12-03 DIAGNOSIS — R062 Wheezing: Secondary | ICD-10-CM

## 2015-12-03 DIAGNOSIS — J189 Pneumonia, unspecified organism: Principal | ICD-10-CM | POA: Diagnosis present

## 2015-12-03 DIAGNOSIS — J069 Acute upper respiratory infection, unspecified: Secondary | ICD-10-CM

## 2015-12-03 DIAGNOSIS — F1210 Cannabis abuse, uncomplicated: Secondary | ICD-10-CM | POA: Diagnosis present

## 2015-12-03 DIAGNOSIS — F17210 Nicotine dependence, cigarettes, uncomplicated: Secondary | ICD-10-CM | POA: Diagnosis present

## 2015-12-03 DIAGNOSIS — R0902 Hypoxemia: Secondary | ICD-10-CM | POA: Diagnosis present

## 2015-12-03 DIAGNOSIS — J209 Acute bronchitis, unspecified: Secondary | ICD-10-CM | POA: Diagnosis present

## 2015-12-03 DIAGNOSIS — Z825 Family history of asthma and other chronic lower respiratory diseases: Secondary | ICD-10-CM

## 2015-12-03 DIAGNOSIS — R030 Elevated blood-pressure reading, without diagnosis of hypertension: Secondary | ICD-10-CM | POA: Diagnosis present

## 2015-12-03 DIAGNOSIS — F909 Attention-deficit hyperactivity disorder, unspecified type: Secondary | ICD-10-CM | POA: Diagnosis present

## 2015-12-03 LAB — CBC WITH DIFFERENTIAL/PLATELET
Basophils Absolute: 0 10*3/uL (ref 0.0–0.1)
Basophils Relative: 0 %
EOS ABS: 0 10*3/uL (ref 0.0–0.7)
EOS PCT: 0 %
HCT: 41.8 % (ref 39.0–52.0)
Hemoglobin: 14.6 g/dL (ref 13.0–17.0)
LYMPHS ABS: 1.6 10*3/uL (ref 0.7–4.0)
Lymphocytes Relative: 11 %
MCH: 30.4 pg (ref 26.0–34.0)
MCHC: 34.9 g/dL (ref 30.0–36.0)
MCV: 86.9 fL (ref 78.0–100.0)
Monocytes Absolute: 0.7 10*3/uL (ref 0.1–1.0)
Monocytes Relative: 5 %
Neutro Abs: 12 10*3/uL — ABNORMAL HIGH (ref 1.7–7.7)
Neutrophils Relative %: 84 %
PLATELETS: 233 10*3/uL (ref 150–400)
RBC: 4.81 MIL/uL (ref 4.22–5.81)
RDW: 13.1 % (ref 11.5–15.5)
WBC: 14.4 10*3/uL — AB (ref 4.0–10.5)

## 2015-12-03 LAB — BASIC METABOLIC PANEL
Anion gap: 11 (ref 5–15)
BUN: 15 mg/dL (ref 6–20)
CALCIUM: 9 mg/dL (ref 8.9–10.3)
CO2: 24 mmol/L (ref 22–32)
CREATININE: 0.82 mg/dL (ref 0.61–1.24)
Chloride: 99 mmol/L — ABNORMAL LOW (ref 101–111)
GFR calc Af Amer: >60 mL/min (ref >60)
Glucose, Bld: 133 mg/dL — ABNORMAL HIGH (ref 65–99)
POTASSIUM: 3.2 mmol/L — AB (ref 3.5–5.1)
SODIUM: 134 mmol/L — AB (ref 135–145)

## 2015-12-03 MED ORDER — BUDESONIDE 0.25 MG/2ML IN SUSP
0.2500 mg | Freq: Two times a day (BID) | RESPIRATORY_TRACT | Status: DC
  Administered 2015-12-04: 0.25 mg via RESPIRATORY_TRACT
  Filled 2015-12-03: qty 2

## 2015-12-03 MED ORDER — IPRATROPIUM-ALBUTEROL 0.5-2.5 (3) MG/3ML IN SOLN
3.0000 mL | Freq: Once | RESPIRATORY_TRACT | Status: AC
  Administered 2015-12-03: 3 mL via RESPIRATORY_TRACT
  Filled 2015-12-03: qty 3

## 2015-12-03 MED ORDER — ALBUTEROL SULFATE (2.5 MG/3ML) 0.083% IN NEBU: 2.5000 mg | INHALATION_SOLUTION | RESPIRATORY_TRACT | Status: DC | PRN

## 2015-12-03 MED ORDER — AZITHROMYCIN 250 MG PO TABS: 250.0000 mg | ORAL_TABLET | Freq: Every day | ORAL | Status: DC

## 2015-12-03 MED ORDER — ACETAMINOPHEN 325 MG PO TABS: 650.0000 mg | ORAL_TABLET | Freq: Four times a day (QID) | ORAL | Status: DC | PRN

## 2015-12-03 MED ORDER — DOXYCYCLINE HYCLATE 100 MG IV SOLR
100.0000 mg | Freq: Two times a day (BID) | INTRAVENOUS | Status: DC
  Administered 2015-12-04: 100 mg via INTRAVENOUS
  Filled 2015-12-03: qty 100

## 2015-12-03 MED ORDER — ALBUTEROL SULFATE (2.5 MG/3ML) 0.083% IN NEBU: 2.5000 mg | INHALATION_SOLUTION | Freq: Four times a day (QID) | RESPIRATORY_TRACT | Status: DC | PRN

## 2015-12-03 MED ORDER — ACETAMINOPHEN 650 MG RE SUPP: 650.0000 mg | Freq: Four times a day (QID) | RECTAL | Status: DC | PRN

## 2015-12-03 MED ORDER — ONDANSETRON HCL 4 MG/2ML IJ SOLN: 4.0000 mg | Freq: Four times a day (QID) | INTRAMUSCULAR | Status: DC | PRN

## 2015-12-03 MED ORDER — AMPHETAMINE-DEXTROAMPHETAMINE 20 MG PO TABS
20.0000 mg | ORAL_TABLET | Freq: Four times a day (QID) | ORAL | Status: DC
  Administered 2015-12-04 (×2): 20 mg via ORAL
  Filled 2015-12-03 (×2): qty 1

## 2015-12-03 MED ORDER — ONDANSETRON HCL 4 MG PO TABS: 4.0000 mg | ORAL_TABLET | Freq: Four times a day (QID) | ORAL | Status: DC | PRN

## 2015-12-03 MED ORDER — BENZONATATE 100 MG PO CAPS: 200.0000 mg | ORAL_CAPSULE | Freq: Two times a day (BID) | ORAL | Status: DC | PRN

## 2015-12-03 MED ORDER — ALPRAZOLAM 1 MG PO TABS
1.0000 mg | ORAL_TABLET | Freq: Four times a day (QID) | ORAL | Status: DC | PRN
  Administered 2015-12-04 (×2): 1 mg via ORAL
  Filled 2015-12-03 (×2): qty 1

## 2015-12-03 MED ORDER — PREDNISONE 20 MG PO TABS: 40.0000 mg | ORAL_TABLET | Freq: Every day | ORAL | Status: DC

## 2015-12-03 MED ORDER — ALBUTEROL SULFATE HFA 108 (90 BASE) MCG/ACT IN AERS: 2.0000 | INHALATION_SPRAY | Freq: Four times a day (QID) | RESPIRATORY_TRACT | Status: DC | PRN

## 2015-12-03 MED ORDER — ENOXAPARIN SODIUM 40 MG/0.4ML ~~LOC~~ SOLN
40.0000 mg | Freq: Every day | SUBCUTANEOUS | Status: DC
  Administered 2015-12-04: 40 mg via SUBCUTANEOUS
  Filled 2015-12-03: qty 0.4

## 2015-12-03 MED ORDER — SODIUM CHLORIDE 0.9 % IV SOLN
INTRAVENOUS | Status: DC
  Administered 2015-12-04: via INTRAVENOUS

## 2015-12-03 MED ORDER — ALBUTEROL SULFATE (2.5 MG/3ML) 0.083% IN NEBU
2.5000 mg | INHALATION_SOLUTION | Freq: Once | RESPIRATORY_TRACT | Status: AC
  Administered 2015-12-03: 2.5 mg via RESPIRATORY_TRACT
  Filled 2015-12-03: qty 3

## 2015-12-03 MED ORDER — ALBUTEROL (5 MG/ML) CONTINUOUS INHALATION SOLN
10.0000 mg/h | INHALATION_SOLUTION | RESPIRATORY_TRACT | Status: AC
  Administered 2015-12-03: 10 mg/h via RESPIRATORY_TRACT
  Filled 2015-12-03: qty 20

## 2015-12-03 MED ORDER — ALBUTEROL SULFATE (2.5 MG/3ML) 0.083% IN NEBU: 5.0000 mg | INHALATION_SOLUTION | Freq: Once | RESPIRATORY_TRACT | Status: DC

## 2015-12-03 MED ORDER — METHYLPREDNISOLONE SODIUM SUCC 125 MG IJ SOLR
125.0000 mg | Freq: Once | INTRAMUSCULAR | Status: AC
Start: 2015-12-03 — End: 2015-12-03
  Administered 2015-12-03: 125 mg via INTRAVENOUS
  Filled 2015-12-03: qty 2

## 2015-12-03 MED ORDER — HYDROCODONE-ACETAMINOPHEN 5-325 MG PO TABS
2.0000 | ORAL_TABLET | Freq: Once | ORAL | Status: AC
Start: 2015-12-03 — End: 2015-12-03
  Administered 2015-12-03: 2 via ORAL
  Filled 2015-12-03: qty 2

## 2015-12-03 MED ORDER — METHYLPREDNISOLONE SODIUM SUCC 40 MG IJ SOLR
40.0000 mg | Freq: Every day | INTRAMUSCULAR | Status: DC
  Administered 2015-12-04: 40 mg via INTRAVENOUS
  Filled 2015-12-03: qty 1

## 2015-12-03 MED ORDER — ALBUTEROL SULFATE (2.5 MG/3ML) 0.083% IN NEBU
2.5000 mg | INHALATION_SOLUTION | RESPIRATORY_TRACT | Status: DC
Start: 2015-12-04 — End: 2015-12-04
  Administered 2015-12-04 (×2): 2.5 mg via RESPIRATORY_TRACT
  Filled 2015-12-03 (×2): qty 3

## 2015-12-03 MED ORDER — IPRATROPIUM BROMIDE 0.02 % IN SOLN: 0.5000 mg | Freq: Once | RESPIRATORY_TRACT | Status: DC

## 2015-12-03 MED ORDER — PREDNISONE 20 MG PO TABS
60.0000 mg | ORAL_TABLET | Freq: Once | ORAL | Status: AC
  Administered 2015-12-03: 60 mg via ORAL
  Filled 2015-12-03: qty 3

## 2015-12-03 NOTE — H&P (Addendum)
[facility] History and Physical  Robert Labrada [id number] DOB: 29-Nov-1981 DOA: 12/03/2015  Referring physician: Mr. Marlon Pel. PCP: No PCP Per Patient  Specialists: Dr. Toy Care. Psychiatrist.  Chief Complaint: Shortness of breath.  HPI: Andrew Tate is a 34 y.o. male with history of tobacco abuse, anxiety disorder and ADHD presents to the ER with complaints of increasing shortness of breath over the last 3 days. Patient also has been having subjective feeling of fever chills with productive cough. Denies any sick contacts or any recent travel. Chest x-ray was unremarkable. Lab work showed leukocytosis. On exam patient has bilateral expiratory wheeze and patient is also hypoxic requiring oxygen. Denies any chest pain. Denies any exertional symptoms. Patient has been admitted for further management of bronchitis.   Review of Systems: As presented in the history of presenting illness, rest negative.  Past Medical History  Diagnosis Date  . Anxiety   . ADHD (attention deficit hyperactivity disorder)    Past Surgical History  Procedure Laterality Date  . Surgery of lip  2006    spit gland removal   Social History:  reports that he has been smoking Cigarettes.  He has been smoking about 0.50 packs per day. He has never used smokeless tobacco. He reports that he drinks alcohol. He reports that he does not use illicit drugs. Where does patient live home. Can patient participate in ADLs? Yes.  No Known Allergies  Family History:  Family History  Problem Relation Age of Onset  . Asthma Brother       Prior to Admission medications   Medication Sig Start Date End Date Taking? Authorizing Provider  ALPRAZolam Duanne Moron) 1 MG tablet Take 1 mg by mouth 4 (four) times daily as needed for anxiety.   Yes Historical Provider, MD  amphetamine-dextroamphetamine (ADDERALL) 20 MG tablet Take 20 mg by mouth 4 (four) times daily.   Yes Historical Provider, MD  ibuprofen (ADVIL,MOTRIN) 200  MG tablet Take 400 mg by mouth every 8 (eight) hours as needed for pain. For pain   Yes Historical Provider, MD  azithromycin (ZITHROMAX Z-PAK) 250 MG tablet Take 1 tablet (250 mg total) by mouth daily. 500mg  PO day 1, then 250mg  PO days 205 12/03/15   Montine Circle, PA-C  benzonatate (TESSALON) 100 MG capsule Take 2 capsules (200 mg total) by mouth 2 (two) times daily as needed for cough. 12/03/15   Montine Circle, PA-C  HYDROcodone-acetaminophen (NORCO/VICODIN) 5-325 MG per tablet Take 1-2 tablets by mouth every 6 (six) hours as needed. Patient not taking: Reported on 12/03/2015 08/25/15   Hyman Bible, PA-C  methocarbamol (ROBAXIN) 500 MG tablet Take 1 tablet (500 mg total) by mouth 2 (two) times daily. Patient not taking: Reported on 12/03/2015 08/25/15   Hyman Bible, PA-C  naproxen (NAPROSYN) 500 MG tablet Take 1 tablet (500 mg total) by mouth 2 (two) times daily. Patient not taking: Reported on 12/03/2015 08/25/15   Hyman Bible, PA-C  predniSONE (DELTASONE) 20 MG tablet Take 2 tablets (40 mg total) by mouth daily. 12/03/15   Montine Circle, PA-C    Physical Exam: Filed Vitals:   12/03/15 2112 12/03/15 2115 12/03/15 2123 12/03/15 2214  BP: 134/69 130/80  136/70  Pulse:   105 88  Temp:   98.6 F (37 C) 98.3 F (36.8 C)  TempSrc:   Oral Oral  Resp:   21 20  Height:    6\' 1"  (1.854 m)  Weight:    114.624 kg (252 lb 11.2 oz)  SpO2:  93% 90%     General:  Moderately built and nourished.  Eyes: Anicteric. No pallor.  ENT: No discharge from the ears eyes nose and mouth.  Neck: No mass felt. No JVD felt.  Cardiovascular: S1 and S2 heard.  Respiratory: Bilateral expiratory wheezes heard. No crepitations.  Abdomen: Soft nontender bowel sounds present.  Skin: No rash.  Musculoskeletal: No edema.  Psychiatric: Appears normal.  Neurologic: Alert awake oriented to time place and person. Moves all extremities.  Labs on Admission:  Basic Metabolic Panel:  Recent  Labs Lab 12/01/15 1237 12/03/15 1904  NA 135 134*  K 3.8 3.2*  CL 102 99*  CO2 26 24  GLUCOSE 108* 133*  BUN 11 15  CREATININE 0.81 0.82  CALCIUM 9.3 9.0   Liver Function Tests:  Recent Labs Lab 12/01/15 1237  AST 25  ALT 29  ALKPHOS 65  BILITOT 0.6  PROT 7.8  ALBUMIN 3.9    Recent Labs Lab 12/01/15 1237  LIPASE 22   No results for input(s): AMMONIA in the last 168 hours. CBC:  Recent Labs Lab 12/01/15 1237 12/03/15 1904  WBC 8.1 14.4*  NEUTROABS  --  12.0*  HGB 13.3 14.6  HCT 40.3 41.8  MCV 88.0 86.9  PLT 205 233   Cardiac Enzymes: No results for input(s): CKTOTAL, CKMB, CKMBINDEX, TROPONINI in the last 168 hours.  BNP (last 3 results) No results for input(s): BNP in the last 8760 hours.  ProBNP (last 3 results) No results for input(s): PROBNP in the last 8760 hours.  CBG: No results for input(s): GLUCAP in the last 168 hours.  Radiological Exams on Admission: Dg Chest 2 View  12/03/2015  CLINICAL DATA:  Shortness of breath with cough and congestion; fever EXAM: CHEST  2 VIEW COMPARISON:  December 01, 2015 FINDINGS: The lungs are clear. The heart size and pulmonary vascularity are normal. No adenopathy. No bone lesions. IMPRESSION: No abnormality noted. Electronically Signed   By: Lowella Grip III M.D.   On: 12/03/2015 14:34    EKG: Independently reviewed. Normal sinus rhythm.  Assessment/Plan Principal Problem:   Acute bronchitis   1. Acute bronchitis - patient is actively wheezing. Patient also has leukocytosis with subjective feeling of fever and chills. At this time I will keep patient on antibiotics with IV steroids Pulmicort and nebulizer. If d-dimer is elevated I will get a CT angiogram of the chest to rule out PE since patient is hypoxic. Since patient also had subjective feeling of fever and chills we will check influenza PCR. 2. Elevated blood pressure - at this time I have placed patient on when necessary IV hydralazine. Closely  follow blood pressure trends. 3. Tobacco abuse - tobacco cessation counseling requested. 4. History of ADHD and anxiety - continue Adderall and Xanax.  Addendum - CT angiography of the chest shows multifocal pneumonia for which I have increased patient's antibiotics to ceftriaxone and Zithromax. We will check check sputum cultures, HIV status, urine for Legionella and strep antigen.   DVT Prophylaxis Lovenox.  Code Status: Full code.  Family Communication: Discussed with patient.  Disposition Plan: Admit to inpatient.    Tahlia Deamer N. [facility] Pager [phone].  If 7PM-7AM, please contact night-coverage [email or web address] Password [facility] 12/03/2015, 10:58 PM

## 2015-12-03 NOTE — ED Notes (Signed)
PT STATES, "I DON'T THINK I CAN AFFORD ALL OF THIS TREATMENT. I THINK I;M GOING TO HAVE TO LEAVE." PROVIDER AND CHARGE RN NOTIFIED.

## 2015-12-03 NOTE — ED Notes (Signed)
Patient reports a fever (100.3), productive cough with green sputum, weakness, and sinus congestion x 4 days. Patient states he was seen at [facility] 2 days ago and had to leave because of work.

## 2015-12-03 NOTE — Discharge Instructions (Signed)
Cough, Adult Coughing is a reflex that clears your throat and your airways. Coughing helps to heal and protect your lungs. It is normal to cough occasionally, but a cough that happens with other symptoms or lasts a long time may be a sign of a condition that needs treatment. A cough may last only 2-3 weeks (acute), or it may last longer than 8 weeks (chronic). CAUSES Coughing is commonly caused by:  Breathing in substances that irritate your lungs.  A viral or bacterial respiratory infection.  Allergies.  Asthma.  Postnasal drip.  Smoking.  Acid backing up from the stomach into the esophagus (gastroesophageal reflux).  Certain medicines.  Chronic lung problems, including COPD (or rarely, lung cancer).  Other medical conditions such as heart failure. HOME CARE INSTRUCTIONS  Pay attention to any changes in your symptoms. Take these actions to help with your discomfort:  Take medicines only as told by your health care provider.  If you were prescribed an antibiotic medicine, take it as told by your health care provider. Do not stop taking the antibiotic even if you start to feel better.  Talk with your health care provider before you take a cough suppressant medicine.  Drink enough fluid to keep your urine clear or pale yellow.  If the air is dry, use a cold steam vaporizer or humidifier in your bedroom or your home to help loosen secretions.  Avoid anything that causes you to cough at work or at home.  If your cough is worse at night, try sleeping in a semi-upright position.  Avoid cigarette smoke. If you smoke, quit smoking. If you need help quitting, ask your health care provider.  Avoid caffeine.  Avoid alcohol.  Rest as needed. SEEK MEDICAL CARE IF:   You have new symptoms.  You cough up pus.  Your cough does not get better after 2-3 weeks, or your cough gets worse.  You cannot control your cough with suppressant medicines and you are losing sleep.  You  develop pain that is getting worse or pain that is not controlled with pain medicines.  You have a fever.  You have unexplained weight loss.  You have night sweats. SEEK IMMEDIATE MEDICAL CARE IF:  You cough up blood.  You have difficulty breathing.  Your heartbeat is very fast.   This information is not intended to replace advice given to you by your health care provider. Make sure you discuss any questions you have with your health care provider.   Document Released: 06/11/2011 Document Revised: 09/03/2015 Document Reviewed: 02/19/2015 Elsevier Interactive Patient Education 2016 [employer] Bronchospasm, Adult A bronchospasm is when the tubes that carry air in and out of your lungs (airways) spasm or tighten. During a bronchospasm it is hard to breathe. This is because the airways get smaller. A bronchospasm can be triggered by:  Allergies. These may be to animals, pollen, food, or mold.  Infection. This is a common cause of bronchospasm.  Exercise.  Irritants. These include pollution, cigarette smoke, strong odors, aerosol sprays, and paint fumes.  Weather changes.  Stress.  Being emotional. HOME CARE   Always have a plan for getting help. Know when to call your doctor and local emergency services (911 in the [place] Know where you can get emergency care.  Only take medicines as told by your doctor.  If you were prescribed an inhaler or nebulizer machine, ask your doctor how to use it correctly. Always use a spacer with your inhaler if you were given  one.  Stay calm during an attack. Try to relax and breathe more slowly.  Control your home environment:  Change your heating and air conditioning filter at least once a month.  Limit your use of fireplaces and wood stoves.  Do not  smoke. Do not  allow smoking in your home.  Avoid perfumes and fragrances.  Get rid of pests (such as roaches and mice) and their droppings.  Throw away plants if you see mold on  them.  Keep your house clean and dust free.  Replace carpet with wood, tile, or vinyl flooring. Carpet can trap dander and dust.  Use allergy-proof pillows, mattress covers, and box spring covers.  Wash bed sheets and blankets every week in hot water. Dry them in a dryer.  Use blankets that are made of polyester or cotton.  Wash hands frequently. GET HELP IF:  You have muscle aches.  You have chest pain.  The thick spit you spit or cough up (sputum) changes from clear or white to yellow, green, gray, or bloody.  The thick spit you spit or cough up gets thicker.  There are problems that may be related to the medicine you are given such as:  A rash.  Itching.  Swelling.  Trouble breathing. GET HELP RIGHT AWAY IF:  You feel you cannot breathe or catch your breath.  You cannot stop coughing.  Your treatment is not helping you breathe better.  You have very bad chest pain. MAKE SURE YOU:   Understand these instructions.  Will watch your condition.  Will get help right away if you are not doing well or get worse.   This information is not intended to replace advice given to you by your health care provider. Make sure you discuss any questions you have with your health care provider.   Document Released: 10/10/2009 Document Revised: 01/03/2015 Document Reviewed: 06/05/2013 Elsevier Interactive Patient Education [employer].

## 2015-12-03 NOTE — ED Notes (Signed)
INITIAL ASSESSMENT COMPLETED. PT C/O WHEEZING AND A PRODUCTIVE COUGH X4 DAYS. AWAITING FURTHER ORDERS.

## 2015-12-03 NOTE — ED Notes (Addendum)
Pt stayed above 93% during ambulation, which was right after breathing treatment

## 2015-12-03 NOTE — ED Notes (Signed)
REPORT WILL BE GIVEN TO 4E NURSE 1413-1. AAOX3. PT IN NO APPARENT DISTRESS. FAMILY AT THE BEDSIDE. WILL TRANSPORT PT TO THE FLOOR. THE OPPORTUNITY TO ASK QUESTIONS WAS PROVIDED.

## 2015-12-03 NOTE — ED Notes (Signed)
Pt dropped to 87% while walking. No distress

## 2015-12-03 NOTE — ED Provider Notes (Signed)
CSN: [id number]     Arrival date & time 12/03/15  1327 History   First MD Initiated Contact with Patient 12/03/15 1503     Chief Complaint  Patient presents with  . Cough  . Fever     (Consider location/radiation/quality/duration/timing/severity/associated sxs/prior Treatment) HPI Comments: Patient presents to the ED with a chief complaint of cough and fever.  He states that he has been having a productive cough, intermittent fever, generalized body aches, fatigue, nausea, vomiting, and sinus congestion x 4 days.  He denies getting a flu shot. He has not tried taking anything to alleviate his symptoms.  He states that he came to be seen 2 days ago, but left prior to being seen.  There are no aggravating or alleviating factors.  He denies history of asthma or COPD.  He smokes cigarettes.  The history is provided by the patient. No language interpreter was used.    Past Medical History  Diagnosis Date  . Anxiety   . ADHD (attention deficit hyperactivity disorder)    Past Surgical History  Procedure Laterality Date  . Surgery of lip  2006    spit gland removal   History reviewed. No pertinent family history. Social History  Substance Use Topics  . Smoking status: Current Every Day Smoker -- 0.50 packs/day    Types: Cigarettes  . Smokeless tobacco: Never Used  . Alcohol Use: Yes     Comment: very rare    Review of Systems  Constitutional: Positive for fever and chills.  HENT: Positive for postnasal drip, rhinorrhea, sinus pressure, sneezing and sore throat.   Respiratory: Positive for cough. Negative for shortness of breath.   Cardiovascular: Negative for chest pain.  Gastrointestinal: Negative for nausea, vomiting, abdominal pain, diarrhea and constipation.  Genitourinary: Negative for dysuria.  All other systems reviewed and are negative.     Allergies  Review of patient's allergies indicates no known allergies.  Home Medications   Prior to Admission medications    Medication Sig Start Date End Date Taking? Authorizing Provider  ALPRAZolam Duanne Moron) 1 MG tablet Take 1 mg by mouth 4 (four) times daily as needed for anxiety.   Yes Historical Provider, MD  amphetamine-dextroamphetamine (ADDERALL) 20 MG tablet Take 20 mg by mouth 4 (four) times daily.   Yes Historical Provider, MD  ibuprofen (ADVIL,MOTRIN) 200 MG tablet Take 400 mg by mouth every 8 (eight) hours as needed for pain. For pain   Yes Historical Provider, MD  HYDROcodone-acetaminophen (NORCO/VICODIN) 5-325 MG per tablet Take 1-2 tablets by mouth every 6 (six) hours as needed. Patient not taking: Reported on 12/03/2015 08/25/15   Hyman Bible, PA-C  methocarbamol (ROBAXIN) 500 MG tablet Take 1 tablet (500 mg total) by mouth 2 (two) times daily. Patient not taking: Reported on 12/03/2015 08/25/15   Hyman Bible, PA-C  naproxen (NAPROSYN) 500 MG tablet Take 1 tablet (500 mg total) by mouth 2 (two) times daily. Patient not taking: Reported on 12/03/2015 08/25/15   Heather Laisure, PA-C   BP 135/88 mmHg  Pulse 105  Temp(Src) 99 F (37.2 C) (Oral)  Resp 14  SpO2 91% Physical Exam  Constitutional: He appears well-developed and well-nourished. No distress.  HENT:  Head: Normocephalic.  Right Ear: External ear normal.  Left Ear: External ear normal.  Mildly erythematous, no tonsillar exudate, no abscess, no stridor, uvula is midline    Eyes: Conjunctivae and EOM are normal. Pupils are equal, round, and reactive to light.  Neck: Normal range of motion. Neck supple.  Cardiovascular: Normal rate, regular rhythm and normal heart sounds.  Exam reveals no gallop and no friction rub.   No murmur heard. Pulmonary/Chest: Effort normal. No stridor. No respiratory distress. He has wheezes. He has no rales. He exhibits no tenderness.  Mild wheezes  Abdominal: Soft. Bowel sounds are normal. He exhibits no distension. There is no tenderness.  Musculoskeletal: Normal range of motion. He exhibits no tenderness.   Neurological: He is alert.  Skin: Skin is warm and dry. No rash noted. He is not diaphoretic.  Psychiatric: He has a normal mood and affect. His behavior is normal. Judgment and thought content normal.  Nursing note and vitals reviewed.   ED Course  Procedures (including critical care time) Results for orders placed or performed during the hospital encounter of 12/03/15  CBC with Differential/Platelet  Result Value Ref Range   WBC 14.4 (H) 4.0 - 10.5 K/uL   RBC 4.81 4.22 - 5.81 MIL/uL   Hemoglobin 14.6 13.0 - 17.0 g/dL   HCT 41.8 39.0 - 52.0 %   MCV 86.9 78.0 - 100.0 fL   MCH 30.4 26.0 - 34.0 pg   MCHC 34.9 30.0 - 36.0 g/dL   RDW 13.1 11.5 - 15.5 %   Platelets 233 150 - 400 K/uL   Neutrophils Relative % 84 %   Neutro Abs 12.0 (H) 1.7 - 7.7 K/uL   Lymphocytes Relative 11 %   Lymphs Abs 1.6 0.7 - 4.0 K/uL   Monocytes Relative 5 %   Monocytes Absolute 0.7 0.1 - 1.0 K/uL   Eosinophils Relative 0 %   Eosinophils Absolute 0.0 0.0 - 0.7 K/uL   Basophils Relative 0 %   Basophils Absolute 0.0 0.0 - 0.1 K/uL  Basic metabolic panel  Result Value Ref Range   Sodium 134 (L) 135 - 145 mmol/L   Potassium 3.2 (L) 3.5 - 5.1 mmol/L   Chloride 99 (L) 101 - 111 mmol/L   CO2 24 22 - 32 mmol/L   Glucose, Bld 133 (H) 65 - 99 mg/dL   BUN 15 6 - 20 mg/dL   Creatinine, Ser 0.82 0.61 - 1.24 mg/dL   Calcium 9.0 8.9 - 10.3 mg/dL   GFR calc non Af Amer >60 >60 mL/min   GFR calc Af Amer >60 >60 mL/min   Anion gap 11 5 - 15   Dg Chest 2 View  12/03/2015  CLINICAL DATA:  Shortness of breath with cough and congestion; fever EXAM: CHEST  2 VIEW COMPARISON:  December 01, 2015 FINDINGS: The lungs are clear. The heart size and pulmonary vascularity are normal. No adenopathy. No bone lesions. IMPRESSION: No abnormality noted. Electronically Signed   By: Lowella Grip III M.D.   On: 12/03/2015 14:34   Dg Chest 2 View  12/01/2015  CLINICAL DATA:  Cough and fever for 2 days EXAM: CHEST  2 VIEW COMPARISON:   May 07, 2013 FINDINGS: Lungs are clear. Heart size and pulmonary vascularity are normal. No adenopathy. No bone lesions. IMPRESSION: No edema or consolidation. Electronically Signed   By: Lowella Grip III M.D.   On: 12/01/2015 13:30     Imaging Review Dg Chest 2 View  12/03/2015  CLINICAL DATA:  Shortness of breath with cough and congestion; fever EXAM: CHEST  2 VIEW COMPARISON:  December 01, 2015 FINDINGS: The lungs are clear. The heart size and pulmonary vascularity are normal. No adenopathy. No bone lesions. IMPRESSION: No abnormality noted. Electronically Signed   By: Lowella Grip III M.D.  On: 12/03/2015 14:34   I have personally reviewed and evaluated these images and lab results as part of my medical decision-making.    MDM   Final diagnoses:  URI (upper respiratory infection)  Wheezing    Patient with cough, body aches, fever.  Consider flu vs pneumonia.  Will check CXR.  CXR is negative.  Afebrile.  Wheezing on exam.  Will give nebs and prednisone.  Will reassess.  More wheezing after first nebs.  Will give another.  Still wheezing.  O2 88% on RA.  95 on 2 L.  Will give CAT nebs.  Ambulates after CAT.  87% while walking.  Patient still very wheezy.  States that he feels out of breath while walking.  Patient seen by and discussed with Dr. Tyrone Nine, who recommends reassing in 1 hour.  If O2 is still low, admit.  8:00 PM Patient again reassessed.  Still has some wheezing.  O2 sat is 88% on RA at rest.  Drops to 86 when he ambulates.   Wheezing, 3 nebs, 2 regular and a continuous. CRITICAL CARE Performed by: Montine Circle   Total critical care time: 40 minutes  Critical care time was exclusive of separately billable procedures and treating other patients.  Critical care was necessary to treat or prevent imminent or life-threatening deterioration.  Critical care was time spent personally by me on the following activities: development of treatment plan  with patient and/or surrogate as well as nursing, discussions with consultants, evaluation of patient's response to treatment, examination of patient, obtaining history from patient or surrogate, ordering and performing treatments and interventions, ordering and review of laboratory studies, ordering and review of radiographic studies, pulse oximetry and re-evaluation of patient's condition.   Montine Circle, PA-C 12/03/15 [place], DO 12/03/15 [phone]

## 2015-12-04 ENCOUNTER — Observation Stay (HOSPITAL_COMMUNITY): Payer: Self-pay

## 2015-12-04 ENCOUNTER — Encounter (HOSPITAL_COMMUNITY): Payer: Self-pay

## 2015-12-04 DIAGNOSIS — J209 Acute bronchitis, unspecified: Secondary | ICD-10-CM

## 2015-12-04 DIAGNOSIS — F908 Attention-deficit hyperactivity disorder, other type: Secondary | ICD-10-CM

## 2015-12-04 LAB — BASIC METABOLIC PANEL
Anion gap: 9 (ref 5–15)
BUN: 15 mg/dL (ref 6–20)
CALCIUM: 9.5 mg/dL (ref 8.9–10.3)
CO2: 22 mmol/L (ref 22–32)
Chloride: 102 mmol/L (ref 101–111)
Creatinine, Ser: 0.71 mg/dL (ref 0.61–1.24)
GFR calc Af Amer: >60 mL/min (ref >60)
GLUCOSE: 159 mg/dL — AB (ref 65–99)
POTASSIUM: 3.9 mmol/L (ref 3.5–5.1)
Sodium: 133 mmol/L — ABNORMAL LOW (ref 135–145)

## 2015-12-04 LAB — RAPID URINE DRUG SCREEN, HOSP PERFORMED
Amphetamines: NOT DETECTED
BARBITURATES: NOT DETECTED
Benzodiazepines: NOT DETECTED
COCAINE: NOT DETECTED
Opiates: POSITIVE — AB
TETRAHYDROCANNABINOL: POSITIVE — AB

## 2015-12-04 LAB — INFLUENZA PANEL BY PCR (TYPE A & B)
H1N1 flu by pcr: NOT DETECTED
Influenza A By PCR: NEGATIVE
Influenza B By PCR: NEGATIVE

## 2015-12-04 LAB — CBC
HEMATOCRIT: 38 % — AB (ref 39.0–52.0)
Hemoglobin: 13 g/dL (ref 13.0–17.0)
MCH: 29 pg (ref 26.0–34.0)
MCHC: 34.2 g/dL (ref 30.0–36.0)
MCV: 84.8 fL (ref 78.0–100.0)
Platelets: 240 10*3/uL (ref 150–400)
RBC: 4.48 MIL/uL (ref 4.22–5.81)
RDW: 13 % (ref 11.5–15.5)
WBC: 10.1 10*3/uL (ref 4.0–10.5)

## 2015-12-04 LAB — D-DIMER, QUANTITATIVE (NOT AT ARMC): D DIMER QUANT: 0.63 ug{FEU}/mL — AB (ref 0.00–0.50)

## 2015-12-04 LAB — HIV ANTIBODY (ROUTINE TESTING W REFLEX): HIV SCREEN 4TH GENERATION: NONREACTIVE

## 2015-12-04 MED ORDER — CEFUROXIME AXETIL 250 MG PO TABS: 250.0000 mg | ORAL_TABLET | Freq: Two times a day (BID) | ORAL | Status: DC

## 2015-12-04 MED ORDER — ALBUTEROL SULFATE (2.5 MG/3ML) 0.083% IN NEBU
2.5000 mg | INHALATION_SOLUTION | Freq: Three times a day (TID) | RESPIRATORY_TRACT | Status: DC
  Administered 2015-12-04: 2.5 mg via RESPIRATORY_TRACT
  Filled 2015-12-04: qty 3

## 2015-12-04 MED ORDER — IOHEXOL 350 MG/ML SOLN
100.0000 mL | Freq: Once | INTRAVENOUS | Status: AC | PRN
  Administered 2015-12-04: 100 mL via INTRAVENOUS

## 2015-12-04 MED ORDER — PREDNISONE 5 MG PO TABS: 5.0000 mg | ORAL_TABLET | Freq: Every day | ORAL | Status: DC

## 2015-12-04 MED ORDER — AZITHROMYCIN 250 MG PO TABS: ORAL_TABLET | ORAL | Status: DC

## 2015-12-04 MED ORDER — DEXTROSE 5 % IV SOLN
500.0000 mg | Freq: Every day | INTRAVENOUS | Status: DC
  Administered 2015-12-04: 500 mg via INTRAVENOUS
  Filled 2015-12-04 (×2): qty 500

## 2015-12-04 MED ORDER — POTASSIUM CHLORIDE CRYS ER 20 MEQ PO TBCR
20.0000 meq | EXTENDED_RELEASE_TABLET | Freq: Once | ORAL | Status: AC
  Administered 2015-12-04: 20 meq via ORAL
  Filled 2015-12-04: qty 1

## 2015-12-04 MED ORDER — DEXTROSE 5 % IV SOLN
2.0000 g | Freq: Every day | INTRAVENOUS | Status: DC
  Administered 2015-12-04: 2 g via INTRAVENOUS
  Filled 2015-12-04: qty 2

## 2015-12-04 NOTE — Progress Notes (Signed)
Tylene Fantasia, NP on call paged regarding pt's report of chest "cramping". Pt described 8/10 pain that started in lower abdomen and worked its way up to his chest. By the time this RN entered room to do EKG and VS, pain had decreased to 2/10. Will continue to monitor pt and carry out plan of care. Carnella Guadalajara I

## 2015-12-04 NOTE — Progress Notes (Signed)
Walked with patient in the hall.  O2 sats stayed between 91-94%. Patient tolerated well.

## 2015-12-04 NOTE — Progress Notes (Signed)
Report received from S. Arline Asp, RN. I agree with previous RN's assessment. Will continue to monitor pt and carry out plan of care. Carnella [place] I

## 2015-12-04 NOTE — Discharge Summary (Signed)
Physician Discharge Summary  Andrew Tate [id number] DOB: 03-Jul-1981 DOA: 12/03/2015  PCP: No PCP Per Patient  Admit date: 12/03/2015 Discharge date: 12/04/2015  Time spent: 20 minutes  Recommendations for Outpatient Follow-up:  1. Follow up with PCP as scheduled  2. Please follow up HIV  Discharge Diagnoses:  Principal Problem:   Acute bronchitis   Discharge Condition: Improved  Diet recommendation: Regular  Filed Weights   12/03/15 2214  Weight: 114.624 kg (252 lb 11.2 oz)    History of present illness:  Please review dictated H and P from 12/7 for details. Briefly, 34 y.o. male with history of tobacco abuse, anxiety disorder and ADHD presents to the ER with complaints of increasing shortness of breath over the last 3 days prior to admission. Patient was noted to be mildly hypoxic and was subsequently admitted for further work up.  Hospital Course:  1. Acute bronchitis - pt presented wheezing. Patient also presented with leukocytosis and subjective feeling of fever and chills. Patient was started on IV antibiotics with IV steroids Pulmicort and nebulizer. CTA chest was neg for PE however was notable for atypical pneumonia. On 12/8, the patient was adamant about leaving the hospital. O2 sats were checked on ambulation and remained over 90% on RA. Pt remained afebrile with leukocytosis resolving overnight. The patient reported feeling much improved. Patient will complete course of PO azithromycin with ceftin with prednisone taper. Patient will be arranged close outpatient follow up. 2. Elevated blood pressure - Patient was continued on when necessary IV hydralazine. Closely follow blood pressure trends. 3. Tobacco abuse - tobacco cessation counseling requested. 4. History of ADHD and anxiety - continue Adderall and Xanax. 5. Marijuana abuse - UDS was positive for marijuana 1. Meds which should be present based on home med rec (amphetamines and benzos) are not present in  UDS  Discharge Exam: Filed Vitals:   12/03/15 2214 12/04/15 0119 12/04/15 0247 12/04/15 0521  BP: 136/70  155/80 158/76  Pulse: 88  91 88  Temp: 98.3 F (36.8 C)  97.8 F (36.6 C) 97.8 F (36.6 C)  TempSrc: Oral  Oral Oral  Resp: 20  18 20   Height: 6\' 1"  (1.854 m)     Weight: 114.624 kg (252 lb 11.2 oz)     SpO2: 90% 95% 91% 90%    General: Awake, in nad Cardiovascular: regular, s1, s2 Respiratory: normal resp effort, no wheezing  Discharge Instructions     Medication List    TAKE these medications        ALPRAZolam 1 MG tablet  Commonly known as:  XANAX  Take 1 mg by mouth 4 (four) times daily as needed for anxiety.     amphetamine-dextroamphetamine 20 MG tablet  Commonly known as:  ADDERALL  Take 20 mg by mouth 4 (four) times daily.     azithromycin 250 MG tablet  Commonly known as:  ZITHROMAX  1 tab po qday x 7 days, zero refills     benzonatate 100 MG capsule  Commonly known as:  TESSALON  Take 2 capsules (200 mg total) by mouth 2 (two) times daily as needed for cough.     cefUROXime 250 MG tablet  Commonly known as:  CEFTIN  Take 1 tablet (250 mg total) by mouth 2 (two) times daily with a meal.     HYDROcodone-acetaminophen 5-325 MG tablet  Commonly known as:  NORCO/VICODIN  Take 1-2 tablets by mouth every 6 (six) hours as needed.     ibuprofen 200 MG  tablet  Commonly known as:  ADVIL,MOTRIN  Take 400 mg by mouth every 8 (eight) hours as needed for pain. For pain     methocarbamol 500 MG tablet  Commonly known as:  ROBAXIN  Take 1 tablet (500 mg total) by mouth 2 (two) times daily.     naproxen 500 MG tablet  Commonly known as:  NAPROSYN  Take 1 tablet (500 mg total) by mouth 2 (two) times daily.     predniSONE 5 MG tablet  Commonly known as:  DELTASONE  Take 1 tablet (5 mg total) by mouth daily with breakfast.       No Known Allergies Follow-up Information    Follow up with Your Primary Care Provider In 1 week.   Why:  Hospital follow  up       The results of significant diagnostics from this hospitalization (including imaging, microbiology, ancillary and laboratory) are listed below for reference.    Significant Diagnostic Studies: Dg Chest 2 View  12/03/2015  CLINICAL DATA:  Shortness of breath with cough and congestion; fever EXAM: CHEST  2 VIEW COMPARISON:  December 01, 2015 FINDINGS: The lungs are clear. The heart size and pulmonary vascularity are normal. No adenopathy. No bone lesions. IMPRESSION: No abnormality noted. Electronically Signed   By: Lowella Grip III M.D.   On: 12/03/2015 14:34   Dg Chest 2 View  12/01/2015  CLINICAL DATA:  Cough and fever for 2 days EXAM: CHEST  2 VIEW COMPARISON:  May 07, 2013 FINDINGS: Lungs are clear. Heart size and pulmonary vascularity are normal. No adenopathy. No bone lesions. IMPRESSION: No edema or consolidation. Electronically Signed   By: Lowella Grip III M.D.   On: 12/01/2015 13:30   Ct Angio Chest Pe W/cm &/or Wo Cm  12/04/2015  CLINICAL DATA:  34 year old male with shortness of breath and elevated D-dimer. EXAM: CT ANGIOGRAPHY CHEST WITH CONTRAST TECHNIQUE: Multidetector CT imaging of the chest was performed using the standard protocol during bolus administration of intravenous contrast. Multiplanar CT image reconstructions and MIPs were obtained to evaluate the vascular anatomy. CONTRAST:  166mL OMNIPAQUE IOHEXOL 350 MG/ML SOLN COMPARISON:  Chest radiograph dated 12/03/2015 FINDINGS: There are bibasilar linear atelectasis/ scarring. Diffuse ground-glass and nodular density predominantly involving the lung bases as well as right upper lobe most compatible with multifocal pneumonia. Clinical correlation and follow-up recommended. There is no pneumothorax. No pleural effusion. The central airways are patent. The thoracic aorta is unremarkable. No CT evidence of pulmonary embolism. There bilateral hilar adenopathy. No cardiomegaly or pericardial effusion. There is no  mediastinal adenopathy. The visualized esophagus and thyroid gland appear unremarkable. There is no axillary adenopathy. The thoracic wall soft tissues appear unremarkable. The visualized upper abdomen are grossly unremarkable. The osseous structures are intact. Review of the MIP images confirms the above findings. IMPRESSION: No CT evidence of pulmonary embolism. Diffuse ground-glass and nodular opacities most compatible with multifocal or atypical pneumonia. Clinical correlation and follow-up resolution recommended. Electronically Signed   By: Anner Crete M.D.   On: 12/04/2015 04:09    Microbiology: No results found for this or any previous visit (from the past 240 hour(s)).   Labs: Basic Metabolic Panel:  Recent Labs Lab 12/01/15 1237 12/03/15 1904 12/04/15 0530  NA 135 134* 133*  K 3.8 3.2* 3.9  CL 102 99* 102  CO2 26 24 22   GLUCOSE 108* 133* 159*  BUN 11 15 15   CREATININE 0.81 0.82 0.71  CALCIUM 9.3 9.0 9.5   Liver  Function Tests:  Recent Labs Lab 12/01/15 1237  AST 25  ALT 29  ALKPHOS 65  BILITOT 0.6  PROT 7.8  ALBUMIN 3.9    Recent Labs Lab 12/01/15 1237  LIPASE 22   No results for input(s): AMMONIA in the last 168 hours. CBC:  Recent Labs Lab 12/01/15 1237 12/03/15 1904 12/04/15 0530  WBC 8.1 14.4* 10.1  NEUTROABS  --  12.0*  --   HGB 13.3 14.6 13.0  HCT 40.3 41.8 38.0*  MCV 88.0 86.9 84.8  PLT 205 233 240   Cardiac Enzymes: No results for input(s): CKTOTAL, CKMB, CKMBINDEX, TROPONINI in the last 168 hours. BNP: BNP (last 3 results) No results for input(s): BNP in the last 8760 hours.  ProBNP (last 3 results) No results for input(s): PROBNP in the last 8760 hours.  CBG: No results for input(s): GLUCAP in the last 168 hours.     Signed:  Malak Orantes, Orpah Melter  [facility] 12/04/2015, 9:49 AM

## 2015-12-04 NOTE — Progress Notes (Signed)
This CM was informed that pt needed a PCP. Pt was given [facility] packet and instructed to go there and make a follow up appointment and they could then help him financially with his discharge prescriptions. Pt states he will do this and was appreciative of information. Marney Doctor RN,BSN,NCM [phone]

## 2015-12-04 NOTE — Progress Notes (Signed)
PHARMACY NOTE -  [facility] has been assisting with dosing of azithromycin/ceftriaxone for CAP. Dosage remains stable at current dosing and need for further dosage adjustment appears unlikely at present.    Will sign off at this time.  Please reconsult if a change in clinical status warrants re-evaluation of dosage.

## 2015-12-04 NOTE — Progress Notes (Signed)
Patient very upset and irritated this morning and wanting to leave because insurance did not cover hospital stay.  Patient asking to speak to charge nurse.  Patient then apologized to nurse for being rude.  Went over discharge summary.  Explained importance of making follow up appointment and taking medications as prescribed.  Preview avs and prescriptions given.  Pt refused wheelchair and walked out with girlfriend.

## 2015-12-09 LAB — CULTURE, BLOOD (ROUTINE X 2)
CULTURE: NO GROWTH
Culture: NO GROWTH

## 2016-01-14 MED FILL — AMPHETAMINE SALTS 20 MG TAB: 20 | 30 days supply | Qty: 120 | Fill #0

## 2016-08-24 IMAGING — CT CT ANGIO CHEST
2 of 6 series · 19 of 36 positions shown · IV contrast (omnipaque)
Comparison: Chest radiograph dated 12/03/2015

CLINICAL DATA: 34-year-old male with shortness of breath and
elevated D-dimer.

EXAM:
CT ANGIOGRAPHY CHEST WITH CONTRAST
TECHNIQUE: Multidetector CT imaging of the chest was performed using the
standard protocol during bolus administration of intravenous
contrast. Multiplanar CT image reconstructions and MIPs were
obtained to evaluate the vascular anatomy.
CONTRAST:  100mL OMNIPAQUE IOHEXOL 350 MG/ML SOLN

[Series 6: pe thins @ 1mm · axial · 0.81mm/px · z∈[+1168,+1406]mm · 18 of 266 slices shown]
[im 14/266  lung]
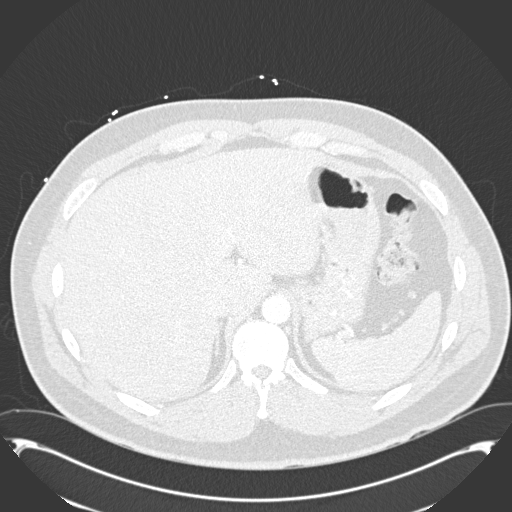
[im 27/266  mediastinal]
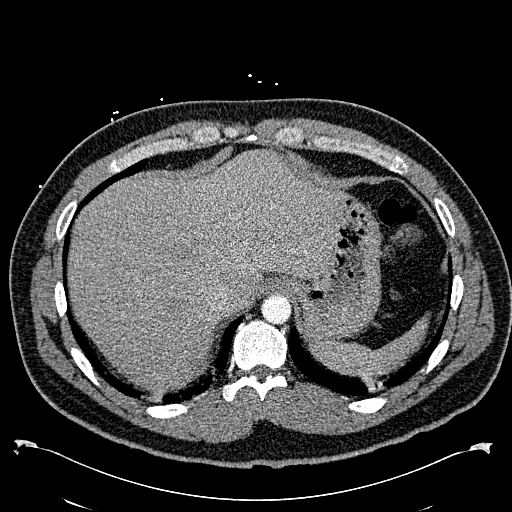
[im 40/266  lung]
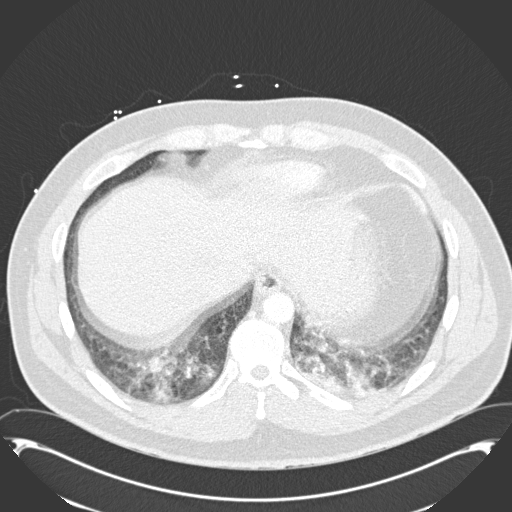
[im 54/266  mediastinal]
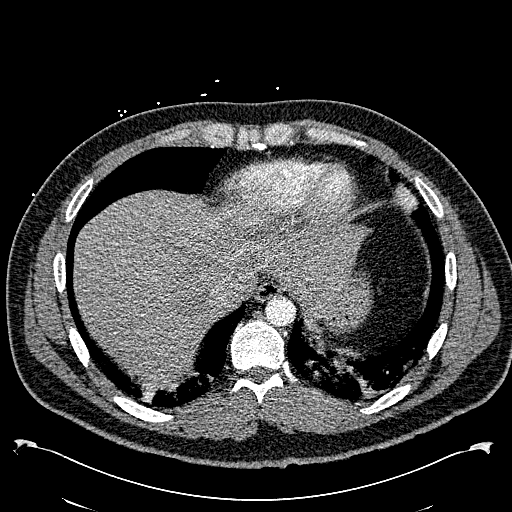
[im 67/266  lung]
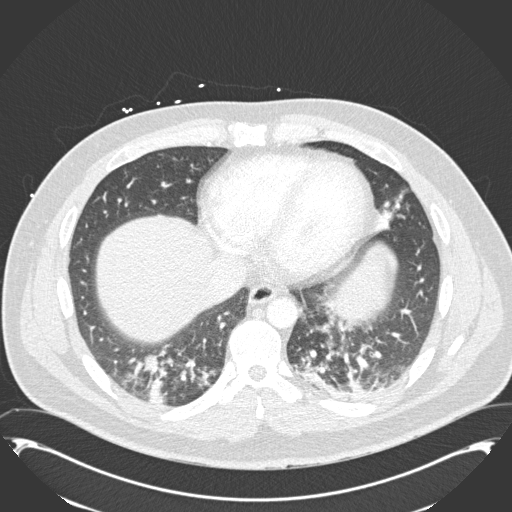
[im 80/266  mediastinal]
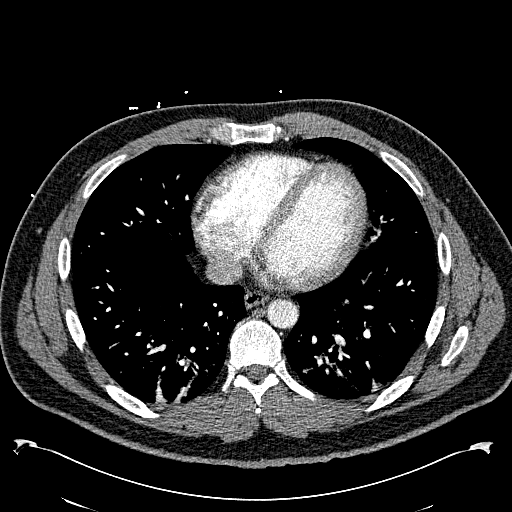
[im 93/266  lung]
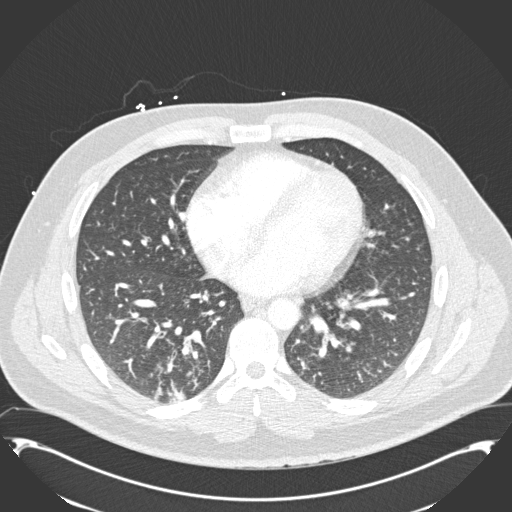
[im 107/266  mediastinal]
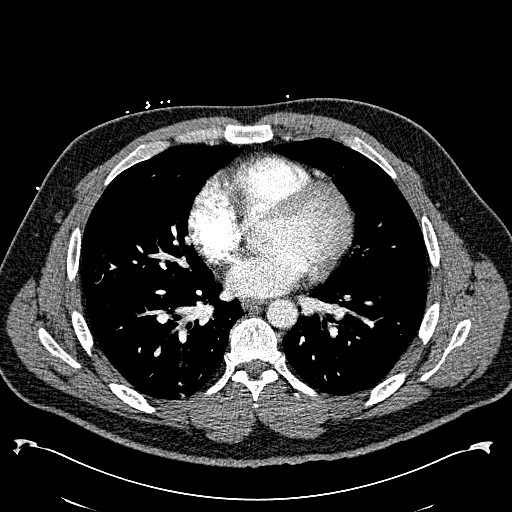
[im 120/266  lung]
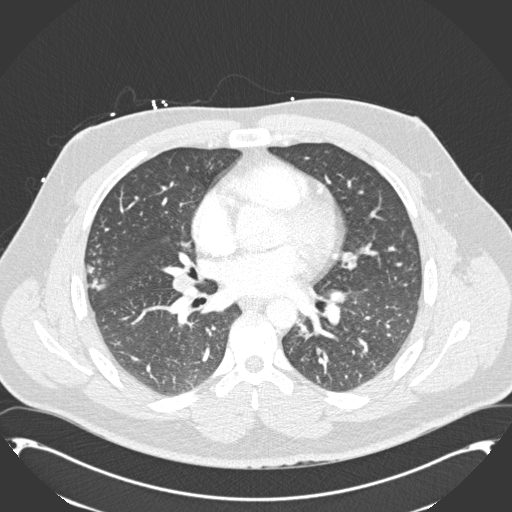
[im 146/266  mediastinal]
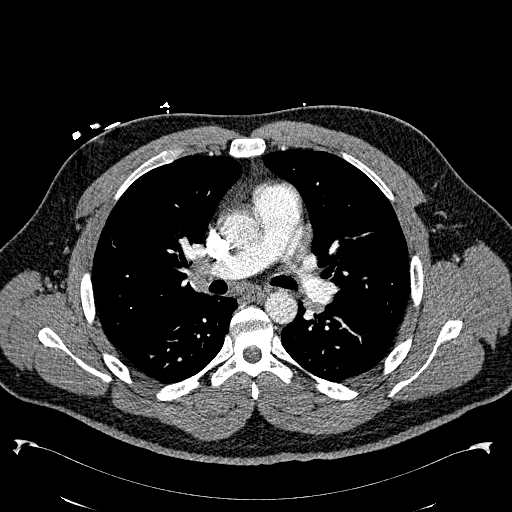
[im 160/266  lung]
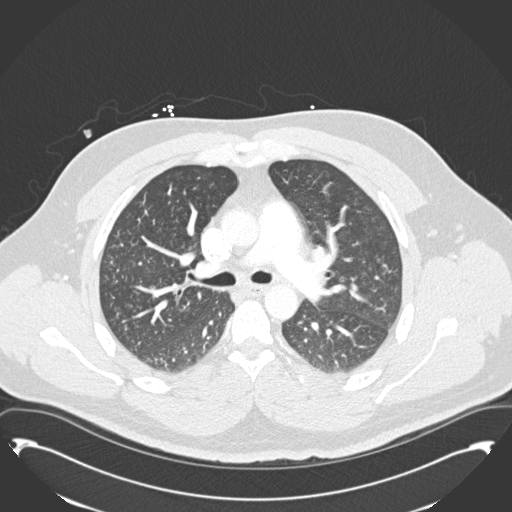
[im 173/266  mediastinal]
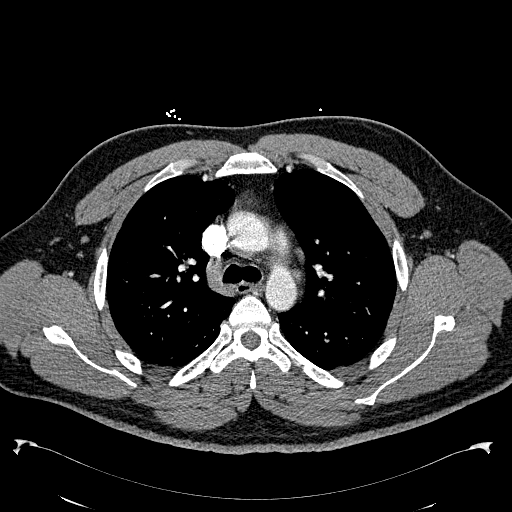
[im 186/266  lung]
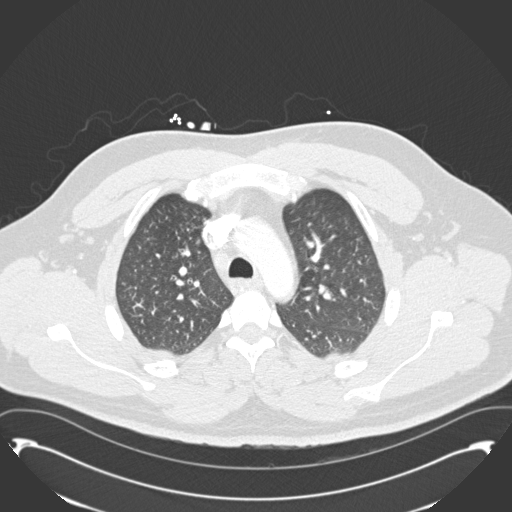
[im 199/266  mediastinal]
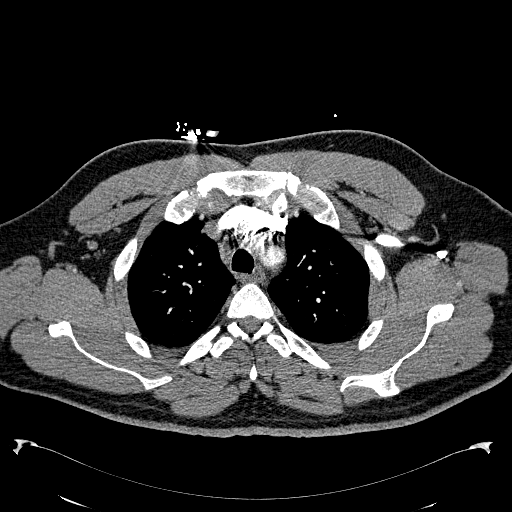
[im 213/266  lung]
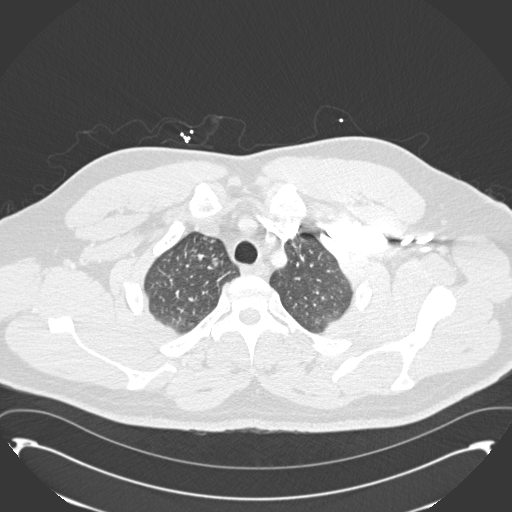
[im 226/266  mediastinal]
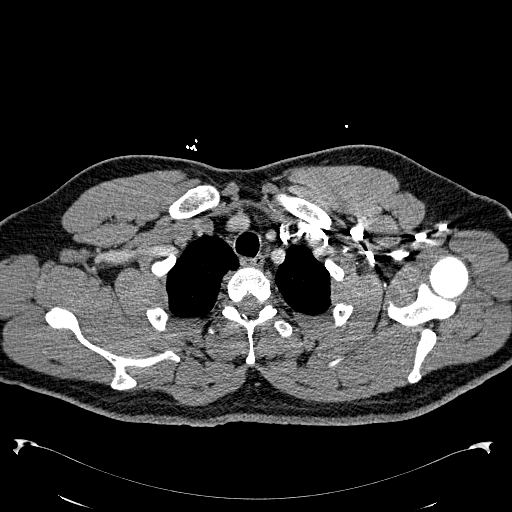
[im 239/266  lung]
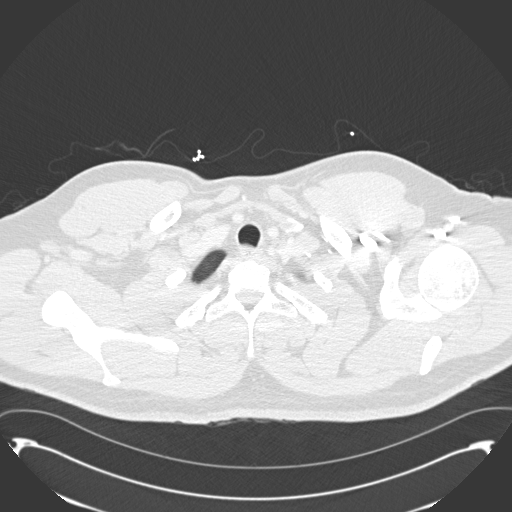
[im 252/266  mediastinal]
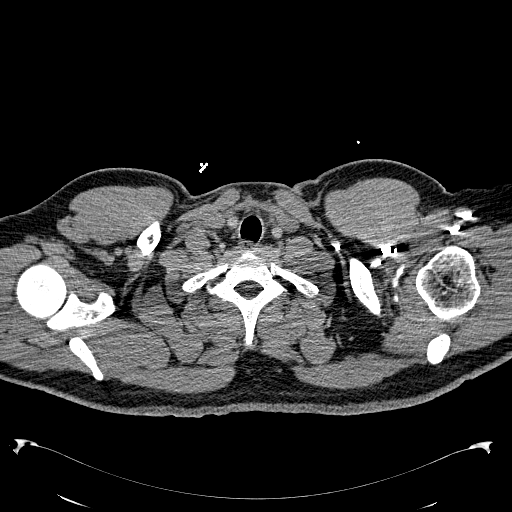

[Series 603: <mpr thick range> · coronal · 0.81mm/px · 1 of 126 slices shown]
[im 63/126  mediastinal]
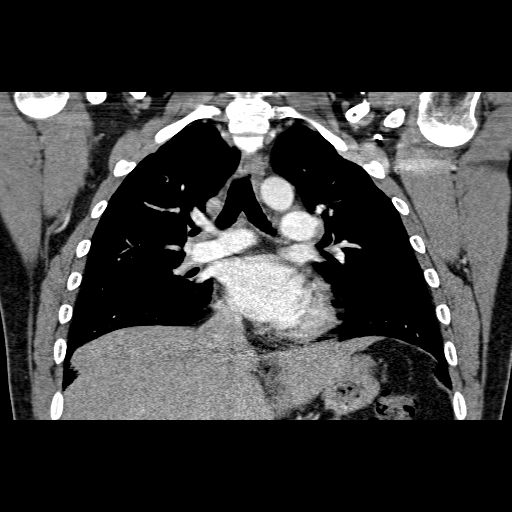

[19 of 36 positions shown; findings below may reference images not displayed]

FINDINGS: There are bibasilar linear atelectasis/ scarring. Diffuse
ground-glass and nodular density predominantly involving the lung
bases as well as right upper lobe most compatible with multifocal
pneumonia. Clinical correlation and follow-up recommended. There is
no pneumothorax. No pleural effusion. The central airways are
patent.

The thoracic aorta is unremarkable. No CT evidence of pulmonary
embolism. There bilateral hilar adenopathy. No cardiomegaly or
pericardial effusion. There is no mediastinal adenopathy. The
visualized esophagus and thyroid gland appear unremarkable. There is
no axillary adenopathy. The thoracic wall soft tissues appear
unremarkable. The visualized upper abdomen are grossly unremarkable.
The osseous structures are intact.

Review of the MIP images confirms the above findings.
IMPRESSION: No CT evidence of pulmonary embolism.

Diffuse ground-glass and nodular opacities most compatible with
multifocal or atypical pneumonia. Clinical correlation and follow-up
resolution recommended.

## 2017-11-27 ENCOUNTER — Emergency Department
Admission: EM | Admit: 2017-11-27 | Discharge: 2017-11-27 | Disposition: A | Discharge status: Home / Self Care | Payer: BLUE CROSS/BLUE SHIELD | Attending: Emergency Medicine | Admitting: Emergency Medicine

## 2017-11-27 ENCOUNTER — Encounter: Payer: Self-pay | Admitting: Emergency Medicine

## 2017-11-27 ENCOUNTER — Emergency Department: Payer: BLUE CROSS/BLUE SHIELD

## 2017-11-27 DIAGNOSIS — Z79899 Other long term (current) drug therapy: Secondary | ICD-10-CM | POA: Insufficient documentation

## 2017-11-27 DIAGNOSIS — F17210 Nicotine dependence, cigarettes, uncomplicated: Secondary | ICD-10-CM | POA: Diagnosis not present

## 2017-11-27 DIAGNOSIS — J189 Pneumonia, unspecified organism: Secondary | ICD-10-CM | POA: Insufficient documentation

## 2017-11-27 DIAGNOSIS — R0602 Shortness of breath: Secondary | ICD-10-CM | POA: Diagnosis present

## 2017-11-27 MED ORDER — PREDNISONE 50 MG PO TABS: ORAL_TABLET | ORAL | 0 refills | Status: DC

## 2017-11-27 MED ORDER — AZITHROMYCIN 250 MG PO TABS
ORAL_TABLET | ORAL | 0 refills | Status: DC
Start: 2017-11-27 — End: 2019-09-13

## 2017-11-27 MED ORDER — CEFTRIAXONE SODIUM 1 G IJ SOLR
1.0000 g | Freq: Once | INTRAMUSCULAR | Status: AC
  Administered 2017-11-27: 1 g via INTRAMUSCULAR
  Filled 2017-11-27: qty 10

## 2017-11-27 NOTE — ED Provider Notes (Signed)
[facility] Emergency Department Provider Note  ____________________________________________  Time seen: Approximately 5:55 PM  I have reviewed the triage vital signs and the nursing notes.   HISTORY  Chief Complaint Shortness of Breath and Cough    HPI Andrew Tate is a 36 y.o. male presenting to the emergency department with productive cough for green sputum production, shortness of breath and fatigue for the past 4 days.  Patient has a history of community-acquired pneumonia.  Patient's last episode of community-acquired pneumonia was 1 year ago and resulted in hospital admission.  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  Patient reports that he has had sweats at work.  He has not evaluated his temperature at home.  No alleviating measures have been attempted.  Patient is a daily smoker.   Past Medical History:  Diagnosis Date  . ADHD (attention deficit hyperactivity disorder)   . Anxiety     Patient Active Problem List   Diagnosis Date Noted  . Hypoxia 12/03/2015  . Acute bronchitis 12/03/2015  . 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 Lipomatosis 05/11/2013    Past Surgical History:  Procedure Laterality Date  . SURGERY OF LIP  2006   spit gland removal    Prior to Admission medications   Medication Sig Start Date End Date Taking? Authorizing Provider  ALPRAZolam Duanne Moron) 1 MG tablet Take 1 mg by mouth 4 (four) times daily as needed for anxiety.    [provider]  amphetamine-dextroamphetamine (ADDERALL) 20 MG tablet Take 20 mg by mouth 4 (four) times daily.    [provider]  azithromycin (ZITHROMAX) 250 MG tablet Take 2 tablets by mouth on the first day. Take one tablet by mouth on days 2-5. 11/27/17   Lannie Fields, PA-C  benzonatate (TESSALON) 100 MG capsule Take 2 capsules (200 mg total) by mouth 2 (two) times daily as needed for cough. 12/03/15   Montine Circle, PA-C  cefUROXime  (CEFTIN) 250 MG tablet Take 1 tablet (250 mg total) by mouth 2 (two) times daily with a meal. 12/04/15   Donne Hazel, MD  HYDROcodone-acetaminophen (NORCO/VICODIN) 5-325 MG per tablet Take 1-2 tablets by mouth every 6 (six) hours as needed. Patient not taking: Reported on 12/03/2015 08/25/15   Hyman Bible, PA-C  ibuprofen (ADVIL,MOTRIN) 200 MG tablet Take 400 mg by mouth every 8 (eight) hours as needed for pain. For pain    [provider]  methocarbamol (ROBAXIN) 500 MG tablet Take 1 tablet (500 mg total) by mouth 2 (two) times daily. Patient not taking: Reported on 12/03/2015 08/25/15   Hyman Bible, PA-C  naproxen (NAPROSYN) 500 MG tablet Take 1 tablet (500 mg total) by mouth 2 (two) times daily. Patient not taking: Reported on 12/03/2015 08/25/15   Hyman Bible, PA-C  predniSONE (DELTASONE) 50 MG tablet Take one tablet daily for the next five days. 11/27/17   Lannie Fields, PA-C    Allergies Patient has no known allergies.  Family History  Problem Relation Age of Onset  . Asthma Brother     Social History Social History   Tobacco Use  . Smoking status: Current Every Day Smoker    Packs/day: 0.50    Types: Cigarettes  . Smokeless tobacco: Never Used  Substance Use Topics  . Alcohol use: Yes    Comment: very rare  . Drug use: No     Review of Systems  Constitutional: Patient has had sweats. Eyes: No  visual changes. No discharge ENT: No upper respiratory complaints. Cardiovascular: no chest pain. Respiratory: Patient has productive cough and shortness of breath. Gastrointestinal: No abdominal pain.  No nausea, no vomiting.  No diarrhea.  No constipation. Musculoskeletal: Negative for musculoskeletal pain. Skin: Negative for rash, abrasions, lacerations, ecchymosis. Neurological: Negative for headaches, focal weakness or numbness.   ____________________________________________   PHYSICAL EXAM:  VITAL SIGNS: ED Triage Vitals [11/27/17 1622]  Enc  Vitals Group     BP 116/71     Pulse Rate 83     Resp      Temp 98.3 F (36.8 C)     Temp Source Oral     SpO2 93 %     Weight 260 lb (117.9 kg)     Height 6\' 1"  (1.854 m)     Head Circumference      Peak Flow      Pain Score      Pain Loc      Pain Edu?      Excl. in [place]?      Constitutional: Alert and oriented. Well appearing and in no acute distress. Eyes: Conjunctivae are normal. PERRL. EOMI. Head: Atraumatic. ENT:      Ears: TMs are pearly bilaterally.      Nose: No congestion/rhinnorhea.      Mouth/Throat: Mucous membranes are moist.  Neck: No stridor. No cervical spine tenderness to palpation. Cardiovascular: Normal rate, regular rhythm. Normal S1 and S2.  Good peripheral circulation. Respiratory: Normal respiratory effort without tachypnea or retractions. Lungs CTAB. Good air entry to the bases with no decreased or absent breath sounds. Gastrointestinal: Bowel sounds 4 quadrants. Soft and nontender to palpation. No guarding or rigidity. No palpable masses. No distention. No CVA tenderness. Skin:  Skin is warm, dry and intact. No rash noted. Psychiatric: Mood and affect are normal. Speech and behavior are normal. Patient exhibits appropriate insight and judgement.   ____________________________________________   LABS (all labs ordered are listed, but only abnormal results are displayed)  Labs Reviewed - No data to display ____________________________________________  EKG   ____________________________________________  RADIOLOGY Unk Pinto, personally viewed and evaluated these images (plain radiographs) as part of my medical decision making, as well as reviewing the written report by the radiologist.  Dg Chest 2 View  Result Date: 11/27/2017 CLINICAL DATA:  SOB, productive cough, and dizziness x 3 days. Hx - PNA 1-2 yrs ago, current smoker 0.5 ppd EXAM: CHEST  2 VIEW COMPARISON:  None. FINDINGS: The heart size and mediastinal contours are within  normal limits. Both lungs are clear. No pleural effusion or pneumothorax. The visualized skeletal structures are unremarkable. IMPRESSION: No active cardiopulmonary disease. Electronically Signed   By: Lajean Manes M.D.   On: 11/27/2017 17:21    ____________________________________________    PROCEDURES  Procedure(s) performed:    Procedures    Medications  cefTRIAXone (ROCEPHIN) injection 1 g (not administered)     ____________________________________________   INITIAL IMPRESSION / ASSESSMENT AND PLAN / ED COURSE  Pertinent labs & imaging results that were available during my care of the patient were reviewed by me and considered in my medical decision making (see chart for details).  Review of the [place] CSRS was performed in accordance of the [place] prior to dispensing any controlled drugs.     Assessment and plan Community-acquired pneumonia Patient presents to the emergency department with cough productive for green sputum production, shortness of breath, fatigue and sweats.  Patient has a history of  community-acquired pneumonia.  Chest x-ray conducted in the emergency department reveals no consolidations.  History and physical exam findings are consistent with community-acquired pneumonia.  An injection of Rocephin was given in the emergency department and patient was discharged with azithromycin and prednisone.  He was advised to follow-up with primary care as needed.  Return precautions were given to return for with new or worsening symptoms.  All patient questions were answered.     ____________________________________________  FINAL CLINICAL IMPRESSION(S) / ED DIAGNOSES  Final diagnoses:  Community acquired pneumonia, unspecified laterality      NEW MEDICATIONS STARTED DURING THIS VISIT:  ED Discharge Orders        Ordered    azithromycin (ZITHROMAX) 250 MG tablet     11/27/17 1749    predniSONE (DELTASONE) 50 MG tablet     11/27/17 1749           This chart was dictated using voice recognition software/Dragon. Despite best efforts to proofread, errors can occur which can change the meaning. Any change was purely unintentional.      Lannie Fields, PA-C 11/27/17 1815    Carrie Mew, MD 11/27/17 [phone]

## 2017-11-27 NOTE — ED Triage Notes (Signed)
Pt presents via POV c/o SOB and cough associated with dizziness with coughing. Report productive cough with green sputum.

## 2019-07-06 ENCOUNTER — Ambulatory Visit: Payer: Self-pay | Admitting: Family Medicine

## 2019-07-16 ENCOUNTER — Ambulatory Visit: Payer: Self-pay | Admitting: Family Medicine

## 2019-09-13 ENCOUNTER — Other Ambulatory Visit: Payer: Self-pay

## 2019-09-13 ENCOUNTER — Encounter: Payer: Self-pay | Admitting: Family Medicine

## 2019-09-13 ENCOUNTER — Ambulatory Visit: Payer: BC Managed Care – PPO | Admitting: Family Medicine

## 2019-09-13 VITALS — BP 119/69 | HR 62 | Temp 98.3°F | Ht 71.5 in | Wt 241.0 lb

## 2019-09-13 DIAGNOSIS — F331 Major depressive disorder, recurrent, moderate: Secondary | ICD-10-CM

## 2019-09-13 DIAGNOSIS — Z23 Encounter for immunization: Secondary | ICD-10-CM | POA: Diagnosis not present

## 2019-09-13 DIAGNOSIS — M79671 Pain in right foot: Secondary | ICD-10-CM

## 2019-09-13 DIAGNOSIS — M79672 Pain in left foot: Secondary | ICD-10-CM

## 2019-09-13 DIAGNOSIS — Z7689 Persons encountering health services in other specified circumstances: Secondary | ICD-10-CM

## 2019-09-13 DIAGNOSIS — R3915 Urgency of urination: Secondary | ICD-10-CM | POA: Diagnosis not present

## 2019-09-13 DIAGNOSIS — F17210 Nicotine dependence, cigarettes, uncomplicated: Secondary | ICD-10-CM

## 2019-09-13 DIAGNOSIS — F419 Anxiety disorder, unspecified: Secondary | ICD-10-CM

## 2019-09-13 DIAGNOSIS — R062 Wheezing: Secondary | ICD-10-CM

## 2019-09-13 LAB — UA/M W/RFLX CULTURE, ROUTINE
Bilirubin, UA: NEGATIVE
Glucose, UA: NEGATIVE
Ketones, UA: NEGATIVE
Leukocytes,UA: NEGATIVE
Nitrite, UA: NEGATIVE
Protein,UA: NEGATIVE
RBC, UA: NEGATIVE
Specific Gravity, UA: 1.025 (ref 1.005–1.030)
Urobilinogen, Ur: 0.2 mg/dL (ref 0.2–1.0)
pH, UA: 5 (ref 5.0–7.5)

## 2019-09-13 MED ORDER — PREDNISONE 20 MG PO TABS: 40.0000 mg | ORAL_TABLET | Freq: Every day | ORAL | 0 refills | Status: DC

## 2019-09-13 NOTE — Progress Notes (Signed)
BP 119/69   Pulse 62   Temp 98.3 F (36.8 C) (Oral)   Ht 5' 11.5" (1.816 m)   Wt 241 lb (109.3 kg)   SpO2 95%   BMI 33.14 kg/m    Subjective:    Patient ID: Andrew Tate, male    DOB: 03/27/1981, 38 y.o.   MRN: [id number]  HPI: Andrew Tate is a 38 y.o. male  Chief Complaint  Patient presents with  . Establish Care    pt would like to discuss about knots on the botom of his feet, smoking problems, referral for psychiatrist and urinary problems   Patient here today to establish care.   Knots on the bottoms of his feet b/l he's noticed the past year. Painful and at the end of the night he can barely walk on them - sharp, stabbing pain. Walks on concrete all day and wonders if that makes it worse. Has tried changing shoes and inserts but has not had any relief. No known injury. Has not tried any medications for this. Does have a hx of lipomas but never in feet.   Urinary urgency for about a year now daily. Has had times where he hasn't made it to the bathroom and has urinated on himself but most of the time he can hold it but it's difficult. Mother has hx of kidney cancer but otherwise no known history of urinary related cancers. Does not know anything about his father or paternal side of family. No concern for STIs, wife was recently checked about 3 weeks ago. Denies hematuria, flank pain, rashes, lesions,   Requesting a referral to Psychiatry due to some marital issues that have come up the past few months. This has triggered some worsening mood issues, anger and irritability, anxiousness. Previous Psychiatrist tried many different medications for him but none seemed to help and the side effects were unbearable.   Smokes about 1/2 ppd, started smoking around age 3. Switched to vaping last year trying to quit but that didn't seem to help. Has never tried any medications for smoking cessations.   Last CPE was about 10 years ago.   Relevant past medical, surgical, family and  social history reviewed and updated as indicated. Interim medical history since our last visit reviewed. Allergies and medications reviewed and updated.  Review of Systems  Per HPI unless specifically indicated above     Objective:    BP 119/69   Pulse 62   Temp 98.3 F (36.8 C) (Oral)   Ht 5' 11.5" (1.816 m)   Wt 241 lb (109.3 kg)   SpO2 95%   BMI 33.14 kg/m   Wt Readings from Last 3 Encounters:  09/13/19 241 lb (109.3 kg)  11/27/17 250 lb (113.4 kg)  12/03/15 252 lb 11.2 oz (114.6 kg)    Physical Exam Vitals signs and nursing note reviewed.  Constitutional:      Appearance: Normal appearance.  HENT:     Head: Atraumatic.  Eyes:     Extraocular Movements: Extraocular movements intact.     Conjunctiva/sclera: Conjunctivae normal.  Neck:     Musculoskeletal: Normal range of motion and neck supple.  Cardiovascular:     Rate and Rhythm: Normal rate and regular rhythm.  Pulmonary:     Effort: Pulmonary effort is normal.     Breath sounds: Wheezing (right) present.  Abdominal:     General: Bowel sounds are normal.     Palpations: Abdomen is soft.     Tenderness: There is  no abdominal tenderness. There is no right CVA tenderness, left CVA tenderness or guarding.  Genitourinary:    Prostate: Normal.  Musculoskeletal: Normal range of motion.        General: Tenderness present.     Comments: Soft tissue masses b/l soles of feet, ttp   Skin:    General: Skin is warm and dry.  Neurological:     General: No focal deficit present.     Mental Status: He is oriented to person, place, and time.  Psychiatric:        Mood and Affect: Mood normal.        Thought Content: Thought content normal.        Judgment: Judgment normal.     Results for orders placed or performed in visit on 09/13/19  UA/M w/rflx Culture, Routine   Specimen: Urine   URINE  Result Value Ref Range   Specific Gravity, UA 1.025 1.005 - 1.030   pH, UA 5.0 5.0 - 7.5   Color, UA Yellow Yellow    Appearance Ur Clear Clear   Leukocytes,UA Negative Negative   Protein,UA Negative Negative/Trace   Glucose, UA Negative Negative   Ketones, UA Negative Negative   RBC, UA Negative Negative   Bilirubin, UA Negative Negative   Urobilinogen, Ur 0.2 0.2 - 1.0 mg/dL   Nitrite, UA Negative Negative  Comprehensive metabolic panel  Result Value Ref Range   Glucose 85 65 - 99 mg/dL   BUN 15 6 - 20 mg/dL   Creatinine, Ser 0.80 0.76 - 1.27 mg/dL   GFR calc non Af Amer 113 >59 mL/min/1.73   GFR calc Af Amer 131 >59 mL/min/1.73   BUN/Creatinine Ratio 19 9 - 20   Sodium 139 134 - 144 mmol/L   Potassium 4.5 3.5 - 5.2 mmol/L   Chloride 102 96 - 106 mmol/L   CO2 23 20 - 29 mmol/L   Calcium 9.2 8.7 - 10.2 mg/dL   Total Protein 7.3 6.0 - 8.5 g/dL   Albumin 4.3 4.0 - 5.0 g/dL   Globulin, Total 3.0 1.5 - 4.5 g/dL   Albumin/Globulin Ratio 1.4 1.2 - 2.2   Bilirubin Total 0.4 0.0 - 1.2 mg/dL   Alkaline Phosphatase 71 39 - 117 IU/L   AST 20 0 - 40 IU/L   ALT 19 0 - 44 IU/L  PSA  Result Value Ref Range   Prostate Specific Ag, Serum 0.3 0.0 - 4.0 ng/mL      Assessment & Plan:   Problem List Items Addressed This Visit      Other   Cigarette smoker    Very interested in quitting, but dealing with increased anxiety currently and this is his coping mechanism. Will get in with Psychiatry to help get his moods and anxiety under control and then work on medications for cessation       Other Visit Diagnoses    Anxiety    -  Primary   Referral placed to Psychiatry for consultation. Very interested in counseling   Relevant Orders   Ambulatory referral to Psychiatry   Encounter to establish care       Moderate episode of recurrent major depressive disorder [facility])       Referral placed to Psychiatry for consultation. Very interested in counseling   Relevant Orders   Ambulatory referral to Psychiatry   Urinary urgency       Prostate exam normal, await labs and adjust as needed. Push fluids. Return  precautions reviewed  Relevant Orders   UA/M w/rflx Culture, Routine (Completed)   Comprehensive metabolic panel (Completed)   PSA (Completed)   Bilateral foot pain       Suspect lipomas based on exam and hx. Referral to podiatry placed for further management   Relevant Orders   Ambulatory referral to Podiatry   Wheezing       Will obtain spirometry testing at next appt for eval for COPD. Prednisone course, mucinex for current episode. F/u if worsening for CXR   Flu vaccine need       Relevant Orders   Flu Vaccine QUAD 36+ mos IM (Completed)       Follow up plan: Return for CPE.

## 2019-09-14 ENCOUNTER — Encounter: Payer: Self-pay | Admitting: Family Medicine

## 2019-09-14 LAB — COMPREHENSIVE METABOLIC PANEL
ALT: 19 IU/L (ref 0–44)
AST: 20 IU/L (ref 0–40)
Albumin/Globulin Ratio: 1.4 (ref 1.2–2.2)
Albumin: 4.3 g/dL (ref 4.0–5.0)
Alkaline Phosphatase: 71 IU/L (ref 39–117)
BUN/Creatinine Ratio: 19 (ref 9–20)
BUN: 15 mg/dL (ref 6–20)
Bilirubin Total: 0.4 mg/dL (ref 0.0–1.2)
CO2: 23 mmol/L (ref 20–29)
Calcium: 9.2 mg/dL (ref 8.7–10.2)
Chloride: 102 mmol/L (ref 96–106)
Creatinine, Ser: 0.8 mg/dL (ref 0.76–1.27)
GFR calc Af Amer: 131 mL/min/{1.73_m2} (ref >59)
GFR calc non Af Amer: 113 mL/min/{1.73_m2} (ref >59)
Globulin, Total: 3 g/dL (ref 1.5–4.5)
Glucose: 85 mg/dL (ref 65–99)
Potassium: 4.5 mmol/L (ref 3.5–5.2)
Sodium: 139 mmol/L (ref 134–144)
Total Protein: 7.3 g/dL (ref 6.0–8.5)

## 2019-09-14 LAB — PSA: Prostate Specific Ag, Serum: 0.3 ng/mL (ref 0.0–4.0)

## 2019-09-17 DIAGNOSIS — F17210 Nicotine dependence, cigarettes, uncomplicated: Secondary | ICD-10-CM | POA: Insufficient documentation

## 2019-09-17 NOTE — Assessment & Plan Note (Signed)
Very interested in quitting, but dealing with increased anxiety currently and this is his coping mechanism. Will get in with Psychiatry to help get his moods and anxiety under control and then work on medications for cessation

## 2019-09-25 ENCOUNTER — Telehealth: Payer: Self-pay | Admitting: Family Medicine

## 2019-09-25 NOTE — Telephone Encounter (Signed)
Andrew Tate, is there somewhere else this patient can go to?

## 2019-09-25 NOTE — Telephone Encounter (Signed)
Copied from [place] [id number]. Topic: General - Other >> Sep 25, 2019  2:03 PM Keene Breath wrote: Reason for CRM: Patient called to ask the nurse to call him regarding a referral to a psychiatrist.  Patient stated that he couldn't get an appt. Until December and he feels that he is feeling worse and did not want to wait that long.  Patient would like to know if there is another doctor that he can be referred to.  CB# [phone] or call his wife at [phone]

## 2019-09-26 NOTE — Telephone Encounter (Signed)
He can go anywhere, please see if someone can see him sooner but remind him about [facility] and [facility] walk in options

## 2019-09-27 ENCOUNTER — Emergency Department
Admission: EM | Admit: 2019-09-27 | Discharge: 2019-09-28 | Disposition: A | Discharge status: Other Acute Inpatient Hospital | Payer: BC Managed Care – PPO | Source: Home / Self Care | Attending: Emergency Medicine | Admitting: Emergency Medicine

## 2019-09-27 ENCOUNTER — Other Ambulatory Visit: Payer: Self-pay

## 2019-09-27 ENCOUNTER — Encounter: Payer: Self-pay | Admitting: Emergency Medicine

## 2019-09-27 DIAGNOSIS — F3162 Bipolar disorder, current episode mixed, moderate: Secondary | ICD-10-CM

## 2019-09-27 DIAGNOSIS — F319 Bipolar disorder, unspecified: Secondary | ICD-10-CM | POA: Insufficient documentation

## 2019-09-27 DIAGNOSIS — R4689 Other symptoms and signs involving appearance and behavior: Secondary | ICD-10-CM | POA: Insufficient documentation

## 2019-09-27 DIAGNOSIS — R45851 Suicidal ideations: Secondary | ICD-10-CM | POA: Insufficient documentation

## 2019-09-27 DIAGNOSIS — Z20828 Contact with and (suspected) exposure to other viral communicable diseases: Secondary | ICD-10-CM | POA: Insufficient documentation

## 2019-09-27 HISTORY — DX: Bipolar disorder, current episode mixed, moderate: F31.62

## 2019-09-27 LAB — CBC
HCT: 44.2 % (ref 39.0–52.0)
Hemoglobin: 15 g/dL (ref 13.0–17.0)
MCH: 29.3 pg (ref 26.0–34.0)
MCHC: 33.9 g/dL (ref 30.0–36.0)
MCV: 86.3 fL (ref 80.0–100.0)
Platelets: 265 10*3/uL (ref 150–400)
RBC: 5.12 MIL/uL (ref 4.22–5.81)
RDW: 13.2 % (ref 11.5–15.5)
WBC: 7.3 10*3/uL (ref 4.0–10.5)
nRBC: 0 % (ref 0.0–0.2)

## 2019-09-27 LAB — COMPREHENSIVE METABOLIC PANEL
ALT: 20 U/L (ref 0–44)
AST: 20 U/L (ref 15–41)
Albumin: 4.3 g/dL (ref 3.5–5.0)
Alkaline Phosphatase: 58 U/L (ref 38–126)
Anion gap: 10 (ref 5–15)
BUN: 21 mg/dL — ABNORMAL HIGH (ref 6–20)
CO2: 22 mmol/L (ref 22–32)
Calcium: 9.2 mg/dL (ref 8.9–10.3)
Chloride: 105 mmol/L (ref 98–111)
Creatinine, Ser: 0.8 mg/dL (ref 0.61–1.24)
GFR calc Af Amer: >60 mL/min (ref >60)
GFR calc non Af Amer: >60 mL/min (ref >60)
Glucose, Bld: 113 mg/dL — ABNORMAL HIGH (ref 70–99)
Potassium: 4.1 mmol/L (ref 3.5–5.1)
Sodium: 137 mmol/L (ref 135–145)
Total Bilirubin: 0.7 mg/dL (ref 0.3–1.2)
Total Protein: 8.4 g/dL — ABNORMAL HIGH (ref 6.5–8.1)

## 2019-09-27 LAB — URINE DRUG SCREEN, QUALITATIVE (ARMC ONLY)
Amphetamines, Ur Screen: NOT DETECTED
Barbiturates, Ur Screen: NOT DETECTED
Benzodiazepine, Ur Scrn: POSITIVE — AB
Cannabinoid 50 Ng, Ur ~~LOC~~: POSITIVE — AB
Cocaine Metabolite,Ur ~~LOC~~: NOT DETECTED
MDMA (Ecstasy)Ur Screen: NOT DETECTED
Methadone Scn, Ur: NOT DETECTED
Opiate, Ur Screen: POSITIVE — AB
Phencyclidine (PCP) Ur S: NOT DETECTED
Tricyclic, Ur Screen: NOT DETECTED

## 2019-09-27 LAB — SARS CORONAVIRUS 2 BY RT PCR (HOSPITAL ORDER, PERFORMED IN ~~LOC~~ HOSPITAL LAB): SARS Coronavirus 2: NEGATIVE

## 2019-09-27 LAB — ETHANOL: Alcohol, Ethyl (B): <10 mg/dL (ref <10)

## 2019-09-27 LAB — ACETAMINOPHEN LEVEL: Acetaminophen (Tylenol), Serum: <10 ug/mL — ABNORMAL LOW (ref 10–30)

## 2019-09-27 LAB — SALICYLATE LEVEL: Salicylate Lvl: <7 mg/dL (ref 2.8–30.0)

## 2019-09-27 MED ORDER — NICOTINE 14 MG/24HR TD PT24
14.0000 mg | MEDICATED_PATCH | Freq: Once | TRANSDERMAL | Status: DC
  Administered 2019-09-27: 19:00:00 14 mg via TRANSDERMAL
  Filled 2019-09-27: qty 1

## 2019-09-27 MED ORDER — LORAZEPAM 1 MG PO TABS: 1.0000 mg | ORAL_TABLET | Freq: Once | ORAL | Status: DC

## 2019-09-27 NOTE — ED Notes (Signed)
Patient talking to psychiatry team Kerry Dory TTS and psychiatry Eddie Dibbles
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
## 2019-09-27 NOTE — ED Notes (Signed)
Hourly rounding reveals patient in room. No complaints, stable, in no acute distress. Q15 minute rounds and monitoring via [place] and Officer to continue.   

## 2019-09-27 NOTE — BH Assessment (Signed)
Writer called and left a HIPPA Compliant message with wife Cloyde Reams [phone]), requesting a return phone call.
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
## 2019-09-27 NOTE — ED Notes (Signed)
Pt waiting in [place] with [place] officer while waiting for room to be cleaned.

## 2019-09-27 NOTE — ED Notes (Signed)
Pt given sandwich tray and ice water at this time. No other needs voiced. Will continue to monitor Q15 minute rounds.
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
## 2019-09-27 NOTE — ED Notes (Addendum)
Blue t shirt, jeans, brown belt, black tennis shoes, black socks, blue boxer briefs placed in pt belongings bag. Pt unwilling to dress out initially, explained to pt that the more he cooperates now, the easier this process will be for him. Pt was angry, stated "fuck it," then began taking off his clothes in an aggressive manor, throwing them on the floor. Pt calmed down after change out, urinated in cup and gave to Annie Main, Therapist, sports. [employer], Annie Main, and myself present during change out.
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
## 2019-09-27 NOTE — ED Notes (Signed)
beverage given.

## 2019-09-27 NOTE — ED Notes (Signed)
Report to include Situation, Background, Assessment, and Recommendations received from [facility]. Patient alert and oriented, warm and dry, in no acute distress. Patient upset, banging the wall. When this writer asked what is wrong. Patient said that he frustrated because his wife committed him falsely.  Patient denies SI, HI, AVH and pain. Patient made aware of Q15 minute rounds and Engineer, drilling presence for their safety. Patient instructed to come to me with needs or concerns.

## 2019-09-28 ENCOUNTER — Other Ambulatory Visit: Payer: Self-pay

## 2019-09-28 ENCOUNTER — Inpatient Hospital Stay
Admission: AD | Admit: 2019-09-28 | Discharge: 2019-10-02 | DRG: 885 | Disposition: A | Discharge status: Home / Self Care | Payer: BC Managed Care – PPO | Attending: Psychiatry | Admitting: Psychiatry

## 2019-09-28 DIAGNOSIS — Z20828 Contact with and (suspected) exposure to other viral communicable diseases: Secondary | ICD-10-CM | POA: Diagnosis present

## 2019-09-28 DIAGNOSIS — Z818 Family history of other mental and behavioral disorders: Secondary | ICD-10-CM | POA: Diagnosis not present

## 2019-09-28 DIAGNOSIS — F17210 Nicotine dependence, cigarettes, uncomplicated: Secondary | ICD-10-CM | POA: Diagnosis present

## 2019-09-28 DIAGNOSIS — F22 Delusional disorders: Secondary | ICD-10-CM | POA: Diagnosis present

## 2019-09-28 DIAGNOSIS — Z8051 Family history of malignant neoplasm of kidney: Secondary | ICD-10-CM | POA: Diagnosis not present

## 2019-09-28 DIAGNOSIS — Z915 Personal history of self-harm: Secondary | ICD-10-CM | POA: Diagnosis not present

## 2019-09-28 DIAGNOSIS — Z8249 Family history of ischemic heart disease and other diseases of the circulatory system: Secondary | ICD-10-CM | POA: Diagnosis not present

## 2019-09-28 DIAGNOSIS — F1199 Opioid use, unspecified with unspecified opioid-induced disorder: Secondary | ICD-10-CM | POA: Diagnosis not present

## 2019-09-28 DIAGNOSIS — F1123 Opioid dependence with withdrawal: Secondary | ICD-10-CM | POA: Diagnosis present

## 2019-09-28 DIAGNOSIS — R45851 Suicidal ideations: Secondary | ICD-10-CM | POA: Diagnosis present

## 2019-09-28 DIAGNOSIS — F419 Anxiety disorder, unspecified: Secondary | ICD-10-CM | POA: Diagnosis present

## 2019-09-28 DIAGNOSIS — F3162 Bipolar disorder, current episode mixed, moderate: Secondary | ICD-10-CM

## 2019-09-28 DIAGNOSIS — G4700 Insomnia, unspecified: Secondary | ICD-10-CM | POA: Diagnosis present

## 2019-09-28 DIAGNOSIS — F1110 Opioid abuse, uncomplicated: Secondary | ICD-10-CM

## 2019-09-28 DIAGNOSIS — F319 Bipolar disorder, unspecified: Secondary | ICD-10-CM | POA: Insufficient documentation

## 2019-09-28 DIAGNOSIS — F909 Attention-deficit hyperactivity disorder, unspecified type: Secondary | ICD-10-CM | POA: Diagnosis present

## 2019-09-28 DIAGNOSIS — F1220 Cannabis dependence, uncomplicated: Secondary | ICD-10-CM | POA: Diagnosis not present

## 2019-09-28 DIAGNOSIS — Z825 Family history of asthma and other chronic lower respiratory diseases: Secondary | ICD-10-CM

## 2019-09-28 DIAGNOSIS — F1111 Opioid abuse, in remission: Secondary | ICD-10-CM

## 2019-09-28 HISTORY — DX: Delusional disorders: F22

## 2019-09-28 MED ORDER — NICOTINE 14 MG/24HR TD PT24
14.0000 mg | MEDICATED_PATCH | Freq: Once | TRANSDERMAL | Status: AC
  Administered 2019-09-29: 08:00:00 14 mg via TRANSDERMAL
  Filled 2019-09-28: qty 1

## 2019-09-28 MED ORDER — ACETAMINOPHEN 325 MG PO TABS
650.0000 mg | ORAL_TABLET | Freq: Four times a day (QID) | ORAL | Status: DC | PRN
  Administered 2019-10-02: 650 mg via ORAL
  Filled 2019-09-28: qty 2

## 2019-09-28 MED ORDER — ALUM & MAG HYDROXIDE-SIMETH 200-200-20 MG/5ML PO SUSP: 30.0000 mL | ORAL | Status: DC | PRN

## 2019-09-28 MED ORDER — METHOCARBAMOL 750 MG PO TABS
750.0000 mg | ORAL_TABLET | Freq: Four times a day (QID) | ORAL | Status: DC | PRN
  Administered 2019-09-30: 09:00:00 750 mg via ORAL
  Filled 2019-09-28 (×2): qty 1

## 2019-09-28 MED ORDER — CLONIDINE HCL 0.1 MG PO TABS
0.1000 mg | ORAL_TABLET | Freq: Four times a day (QID) | ORAL | Status: DC | PRN
  Administered 2019-09-28 – 2019-09-30 (×2): 0.1 mg via ORAL
  Filled 2019-09-28 (×2): qty 1

## 2019-09-28 MED ORDER — NICOTINE 14 MG/24HR TD PT24
14.0000 mg | MEDICATED_PATCH | Freq: Once | TRANSDERMAL | Status: DC
  Filled 2019-09-28: qty 1

## 2019-09-28 MED ORDER — HYDROXYZINE HCL 50 MG PO TABS
50.0000 mg | ORAL_TABLET | Freq: Three times a day (TID) | ORAL | Status: DC | PRN
  Administered 2019-09-28 – 2019-09-29 (×3): 50 mg via ORAL
  Filled 2019-09-28 (×3): qty 1

## 2019-09-28 MED ORDER — ONDANSETRON HCL 4 MG PO TABS
4.0000 mg | ORAL_TABLET | Freq: Three times a day (TID) | ORAL | Status: DC | PRN
  Administered 2019-09-29: 19:00:00 4 mg via ORAL
  Filled 2019-09-28: qty 1

## 2019-09-28 MED ORDER — MAGNESIUM HYDROXIDE 400 MG/5ML PO SUSP: 30.0000 mL | Freq: Every day | ORAL | Status: DC | PRN

## 2019-09-28 MED ORDER — LOPERAMIDE HCL 2 MG PO CAPS: 2.0000 mg | ORAL_CAPSULE | ORAL | Status: DC | PRN

## 2019-09-28 MED ORDER — QUETIAPINE FUMARATE 100 MG PO TABS: 100.0000 mg | ORAL_TABLET | Freq: Every day | ORAL | Status: DC

## 2019-09-28 NOTE — H&P (Signed)
Psychiatric Admission Assessment Adult  Patient Identification: Andrew Tate MRN:  [id number] Date of Evaluation:  09/28/2019 Chief Complaint:  Bipolar Principal Diagnosis: Delusional disorder ([place]) Diagnosis:  Principal Problem:   Delusional disorder ([place]) Active Problems:   Opiate abuse, continuous ([place])  History of Present Illness: Patient seen chart reviewed.  This is a patient with a past history of anger problems who was brought in under petition filed by his wife.  She alleges that he had made suicidal statements and that he had been physically violent with her.  She reports that he has been paranoid recently behaving in irrational ways based on his belief that she is having an affair.  Changing his work habits staying up late at night.  In interview today the patient does not believe that anything about his behavior is representative of a mental health problem but rather is normal in his circumstances.  Apparently his wife admitted to him having had an infidelity several months ago but has denied any ongoing behavior.  Patient remains convinced that he has caught proof of it on his home security cameras.  He admits that he has left work at times to try and catch her at home.  Admits that he is sleeping poorly.  Appetite is normal.  Patient denies any hallucinations outside of the possible abnormal things he is hearing on the camera.  He is not currently taking any psychiatric medicine or seeing anyone for mental health care.  He does take Suboxone which he obtains illegally.  Denies other illegal drug use other than some marijuana.  Patient absolutely denies being physically violent with his wife or any suicidal statements or thoughts. Associated Signs/Symptoms: Depression Symptoms:  insomnia, psychomotor agitation, anxiety, (Hypo) Manic Symptoms:  Distractibility, Impulsivity, Anxiety Symptoms:  Excessive Worry, Psychotic Symptoms:  Ideas of Reference, Paranoia, PTSD  Symptoms: Negative Total Time spent with patient: 1 hour  Past Psychiatric History: Patient has a past history of treatment for ADHD and anger problems.  He remembers having been prescribed stimulants and having been prescribed Seroquel in the past but does not remember any other medicines he took.  He has 1 previous self injury by cutting over 10 years ago.  Had a brief hospitalization for that.  No other hospitalizations.  Denies any other self injury or suicide attempts.  Is the patient at risk to self? No.  Has the patient been a risk to self in the past 6 months? No.  Has the patient been a risk to self within the distant past? No.  Is the patient a risk to others? Yes.    Has the patient been a risk to others in the past 6 months? No.  Has the patient been a risk to others within the distant past? No.   Prior Inpatient Therapy:   Prior Outpatient Therapy:    Alcohol Screening: 1. How often do you have a drink containing alcohol?: Monthly or less 2. How many drinks containing alcohol do you have on a typical day when you are drinking?: 1 or 2 3. How often do you have six or more drinks on one occasion?: Never AUDIT-C Score: 1 4. How often during the last year have you found that you were not able to stop drinking once you had started?: Never 5. How often during the last year have you failed to do what was normally expected from you becasue of drinking?: Never 6. How often during the last year have you needed a first drink in the morning  to get yourself going after a heavy drinking session?: Never 7. How often during the last year have you had a feeling of guilt of remorse after drinking?: Never 8. How often during the last year have you been unable to remember what happened the night before because you had been drinking?: Never 9. Have you or someone else been injured as a result of your drinking?: No 10. Has a relative or friend or a doctor or another health worker been concerned  about your drinking or suggested you cut down?: No Alcohol Use Disorder Identification Test Final Score (AUDIT): 1 Alcohol Brief Interventions/Follow-up: AUDIT Score <7 follow-up not indicated Substance Abuse History in the last 12 months:  Yes.   Consequences of Substance Abuse: Medical Consequences:  Patient has been continuing to use Suboxone regularly which she obtains illegally Previous Psychotropic Medications: Yes  Psychological Evaluations: Yes  Past Medical History:  Past Medical History:  Diagnosis Date  . ADHD (attention deficit hyperactivity disorder)   . Anxiety     Past Surgical History:  Procedure Laterality Date  . SURGERY OF LIP  2006   spit gland removal   Family History:  Family History  Problem Relation Age of Onset  . Heart disease Mother   . Bipolar disorder Mother   . Kidney cancer Mother   . Asthma Brother    Family Psychiatric  History: He says his mother has bipolar disorder Tobacco Screening: Have you used any form of tobacco in the last 30 days? (Cigarettes, Smokeless Tobacco, Cigars, and/or Pipes): Yes Tobacco use, Select all that apply: 4 or less cigarettes per day Are you interested in Tobacco Cessation Medications?: Yes, will notify MD for an order Counseled patient on smoking cessation including recognizing danger situations, developing coping skills and basic information about quitting provided: Yes Social History:  Social History   Substance and Sexual Activity  Alcohol Use Never  . Frequency: Never     Social History   Substance and Sexual Activity  Drug Use Yes  . Types: Marijuana    Additional Social History:                           Allergies:  No Known Allergies Lab Results:  Results for orders placed or performed during the hospital encounter of 09/27/19 (from the past 48 hour(s))  Comprehensive metabolic panel     Status: Abnormal   Collection Time: 09/27/19  2:14 PM  Result Value Ref Range   Sodium 137 135 -  145 mmol/L   Potassium 4.1 3.5 - 5.1 mmol/L   Chloride 105 98 - 111 mmol/L   CO2 22 22 - 32 mmol/L   Glucose, Bld 113 (H) 70 - 99 mg/dL   BUN 21 (H) 6 - 20 mg/dL   Creatinine, Ser 0.80 0.61 - 1.24 mg/dL   Calcium 9.2 8.9 - 10.3 mg/dL   Total Protein 8.4 (H) 6.5 - 8.1 g/dL   Albumin 4.3 3.5 - 5.0 g/dL   AST 20 15 - 41 U/L   ALT 20 0 - 44 U/L   Alkaline Phosphatase 58 38 - 126 U/L   Total Bilirubin 0.7 0.3 - 1.2 mg/dL   GFR calc non Af Amer >60 >60 mL/min   GFR calc Af Amer >60 >60 mL/min   Anion gap 10 5 - 15    Comment: Performed at [facility], [address] [place], [place] [address]  Ethanol     Status:  None   Collection Time: 09/27/19  2:14 PM  Result Value Ref Range   Alcohol, Ethyl (B) <10 <10 mg/dL    Comment: (NOTE) Lowest detectable limit for serum alcohol is 10 mg/dL. For medical purposes only. Performed at [facility], [address] [place], [place] [id number]   Salicylate level     Status: None   Collection Time: 09/27/19  2:14 PM  Result Value Ref Range   Salicylate Lvl [id number] 2.8 - 30.0 mg/dL    Comment: Performed at [facility], [address] [place], [place] [address]  Acetaminophen level     Status: Abnormal   Collection Time: 09/27/19  2:14 PM  Result Value Ref Range   Acetaminophen (Tylenol), Serum <10 (L) 10 - 30 ug/mL    Comment: (NOTE) Therapeutic concentrations vary significantly. A range of 10-30 ug/mL  may be an effective concentration for many patients. However, some  are best treated at concentrations outside of this range. Acetaminophen concentrations >150 ug/mL at 4 hours after ingestion  and >50 ug/mL at 12 hours after ingestion are often associated with  toxic reactions. Performed at [facility], [address] [place], [place] [address]   cbc     Status: None   Collection Time: 09/27/19  2:14 PM  Result Value Ref Range   WBC 7.3 4.0 - 10.5 K/uL   RBC 5.12 4.22 - 5.81 MIL/uL   Hemoglobin  15.0 13.0 - 17.0 g/dL   HCT 44.2 39.0 - 52.0 %   MCV 86.3 80.0 - 100.0 fL   MCH 29.3 26.0 - 34.0 pg   MCHC 33.9 30.0 - 36.0 g/dL   RDW 13.2 11.5 - 15.5 %   Platelets 265 150 - 400 K/uL   nRBC 0.0 0.0 - 0.2 %    Comment: Performed at [facility], [address], [place],  [address]  Urine Drug Screen, Qualitative     Status: Abnormal   Collection Time: 09/27/19  2:14 PM  Result Value Ref Range   Tricyclic, Ur Screen NONE DETECTED NONE DETECTED   Amphetamines, Ur Screen NONE DETECTED NONE DETECTED   MDMA (Ecstasy)Ur Screen NONE DETECTED NONE DETECTED   Cocaine Metabolite,Ur [place] NONE DETECTED NONE DETECTED   Opiate, Ur Screen POSITIVE (A) NONE DETECTED   Phencyclidine (PCP) Ur S NONE DETECTED NONE DETECTED   Cannabinoid 50 Ng, Ur [place] POSITIVE (A) NONE DETECTED   Barbiturates, Ur Screen NONE DETECTED NONE DETECTED   Benzodiazepine, Ur Scrn POSITIVE (A) NONE DETECTED   Methadone Scn, Ur NONE DETECTED NONE DETECTED    Comment: (NOTE) Tricyclics + metabolites, urine    Cutoff 1000 ng/mL Amphetamines + metabolites, urine  Cutoff 1000 ng/mL MDMA (Ecstasy), urine              Cutoff 500 ng/mL Cocaine Metabolite, urine          Cutoff 300 ng/mL Opiate + metabolites, urine        Cutoff 300 ng/mL Phencyclidine (PCP), urine         Cutoff 25 ng/mL Cannabinoid, urine                 Cutoff 50 ng/mL Barbiturates + metabolites, urine  Cutoff 200 ng/mL Benzodiazepine, urine              Cutoff 200 ng/mL Methadone, urine                   Cutoff 300 ng/mL The urine drug screen  provides only a preliminary, unconfirmed analytical test result and should not be used for non-medical purposes. Clinical consideration and professional judgment should be applied to any positive drug screen result due to possible interfering substances. A more specific alternate chemical method must be used in order to obtain a confirmed analytical result. Gas chromatography / mass spectrometry (GC/MS) is  the preferred confirmat ory method. Performed at [facility], [address] [place], [place] [address]   SARS Coronavirus 2 [facility] order, Performed in [facility] hospital lab) Nasopharyngeal Nasopharyngeal Swab     Status: None   Collection Time: 09/27/19  9:01 PM   Specimen: Nasopharyngeal Swab  Result Value Ref Range   SARS Coronavirus 2 NEGATIVE NEGATIVE    Comment: (NOTE) If result is NEGATIVE SARS-CoV-2 target nucleic acids are NOT DETECTED. The SARS-CoV-2 RNA is generally detectable in upper and lower  respiratory specimens during the acute phase of infection. The lowest  concentration of SARS-CoV-2 viral copies this assay can detect is 250  copies / mL. A negative result does not preclude SARS-CoV-2 infection  and should not be used as the sole basis for treatment or other  patient management decisions.  A negative result may occur with  improper specimen collection / handling, submission of specimen other  than nasopharyngeal swab, presence of viral mutation(s) within the  areas targeted by this assay, and inadequate number of viral copies  (<250 copies / mL). A negative result must be combined with clinical  observations, patient history, and epidemiological information. If result is POSITIVE SARS-CoV-2 target nucleic acids are DETECTED. The SARS-CoV-2 RNA is generally detectable in upper and lower  respiratory specimens dur ing the acute phase of infection.  Positive  results are indicative of active infection with SARS-CoV-2.  Clinical  correlation with patient history and other diagnostic information is  necessary to determine patient infection status.  Positive results do  not rule out bacterial infection or co-infection with other viruses. If result is PRESUMPTIVE POSTIVE SARS-CoV-2 nucleic acids MAY BE PRESENT.   A presumptive positive result was obtained on the submitted specimen  and confirmed on repeat testing.  While 2019 novel coronavirus   (SARS-CoV-2) nucleic acids may be present in the submitted sample  additional confirmatory testing may be necessary for epidemiological  and / or clinical management purposes  to differentiate between  SARS-CoV-2 and other Sarbecovirus currently known to infect humans.  If clinically indicated additional testing with an alternate test  methodology [phone]) is advised. The SARS-CoV-2 RNA is generally  detectable in upper and lower respiratory sp ecimens during the acute  phase of infection. The expected result is Negative. Fact Sheet for Patients:  StrictlyIdeas.no Fact Sheet for Healthcare Providers: BankingDealers.co.za This test is not yet approved or cleared by the [place] FDA and has been authorized for detection and/or diagnosis of SARS-CoV-2 by FDA under an Emergency Use Authorization (EUA).  This EUA will remain in effect (meaning this test can be used) for the duration of the COVID-19 declaration under Section 564(b)(1) of the Act, 21 U.S.C. section 360bbb-3(b)(1), unless the authorization is terminated or revoked sooner. Performed at [facility] [address] [place], [place] [address]     Blood Alcohol level:  Lab Results  Component Value Date   [facility] <10 [id number]    Metabolic Disorder Labs:  No results found for: HGBA1C, MPG No results found for: PROLACTIN No results found for: CHOL, TRIG, HDL, CHOLHDL, VLDL, LDLCALC  Current Medications: Current Facility-Administered Medications  Medication Dose Route Frequency Provider Last Rate Last Dose  . acetaminophen (TYLENOL) tablet 650 mg  650 mg Oral Q6H PRN Cristofano, Dorene Ar, MD      . alum & mag hydroxide-simeth (MAALOX/MYLANTA) 200-200-20 MG/5ML suspension 30 mL  30 mL Oral Q4H PRN Cristofano, Paul A, MD      . cloNIDine (CATAPRES) tablet 0.1 mg  0.1 mg Oral Q6H PRN Dariela Stoker T, MD      . hydrOXYzine (ATARAX/VISTARIL) tablet 50 mg  50 mg Oral  TID PRN Raymondo Garcialopez, Madie Reno, MD   50 mg at 09/28/19 1451  . loperamide (IMODIUM) capsule 2 mg  2 mg Oral PRN Toron Bowring T, MD      . magnesium hydroxide (MILK OF MAGNESIA) suspension 30 mL  30 mL Oral Daily PRN Cristofano, Dorene Ar, MD      . methocarbamol (ROBAXIN) tablet 750 mg  750 mg Oral Q6H PRN Favor Kreh T, MD      . nicotine (NICODERM CQ - dosed in mg/24 hours) patch 14 mg  14 mg Transdermal Once Cristofano, Paul A, MD      . ondansetron (ZOFRAN) tablet 4 mg  4 mg Oral Q8H PRN Maite Burlison, Madie Reno, MD       PTA Medications: Medications Prior to Admission  Medication Sig Dispense Refill Last Dose  . ibuprofen (ADVIL,MOTRIN) 200 MG tablet Take 400 mg by mouth every 8 (eight) hours as needed for pain. For pain     . predniSONE (DELTASONE) 20 MG tablet Take 2 tablets (40 mg total) by mouth daily with breakfast. (Patient not taking: Reported on 09/27/2019) 10 tablet 0     Musculoskeletal: Strength & Muscle Tone: within normal limits Gait & Station: normal Patient leans: N/A  Psychiatric Specialty Exam: Physical Exam  Nursing note and vitals reviewed. Constitutional: He appears well-developed and well-nourished.  HENT:  Head: Normocephalic and atraumatic.  Eyes: Pupils are equal, round, and reactive to light. Conjunctivae are normal.  Neck: Normal range of motion.  Cardiovascular: Regular rhythm and normal heart sounds.  Respiratory: Effort normal. No respiratory distress.  GI: Soft.  Musculoskeletal: Normal range of motion.  Neurological: He is alert.  Skin: Skin is warm and dry.  Psychiatric: His speech is normal. His mood appears anxious. His affect is angry and labile. He is agitated. He is not aggressive. Thought content is paranoid. He expresses impulsivity and inappropriate judgment. He expresses no homicidal and no suicidal ideation.    Review of Systems  Constitutional: Negative.   HENT: Negative.   Eyes: Negative.   Respiratory: Negative.   Cardiovascular: Negative.    Gastrointestinal: Negative.   Musculoskeletal: Negative.   Skin: Negative.   Neurological: Negative.   Psychiatric/Behavioral: Positive for substance abuse. Negative for depression and suicidal ideas. The patient is nervous/anxious and has insomnia.     Blood pressure (!) 130/100, pulse 64, temperature 98 F (36.7 C), temperature source Oral, resp. rate 16, height 5\' 11"  (1.803 m), weight 108.9 kg, SpO2 97 %.Body mass index is 33.47 kg/m.  General Appearance: Casual  Eye Contact:  Good  Speech:  Clear and Coherent  Volume:  Increased  Mood:  Angry, Dysphoric and Irritable  Affect:  Congruent  Thought Process:  Coherent  Orientation:  Full (Time, Place, and Person)  Thought Content:  Illogical and Paranoid Ideation  Suicidal Thoughts:  No  Homicidal Thoughts:  No  Memory:  Immediate;   Fair Recent;   Fair Remote;   Fair  Judgement:  Impaired  Insight:  Shallow  Psychomotor Activity:  Restlessness  Concentration:  Concentration: Poor  Recall:  Poor  Fund of Knowledge:  Fair  Language:  Fair  Akathisia:  No  Handed:  Right  AIMS (if indicated):     Assets:  Desire for Improvement Housing Physical Health Resilience  ADL's:  Intact  Cognition:  WNL  Sleep:       Treatment Plan Summary: Daily contact with patient to assess and evaluate symptoms and progress in treatment, Medication management and Plan Differential diagnosis includes delusional disorder, mixed or depressed bipolar disorder, adjustment disorder with psychotic features or possibly it could be interpreted as just a normal situation without a diagnosis.  We have contradictory information with the wife alleging dangerousness and the patient denying it.  Patient is currently irritable and refusing any specific treatment although he agreed to medication as needed for opiate withdrawal as we are not going to continue the Suboxone or any opiates.  Also Vistaril as needed for anxiety.  We will continue observation of  behavior and thinking.  I did not call his wife back as it looks like Dr. Dena Billet has already done that.  I would be interested in speaking to the patient's mother but we have no phone number currently available.  Continue 15-minute checks.  Observation Level/Precautions:  15 minute checks  Laboratory:  Chemistry Profile  Psychotherapy:    Medications:    Consultations:    Discharge Concerns:    Estimated LOS:  Other:     Physician Treatment Plan for Primary Diagnosis: Delusional disorder ([place]) Long Term Goal(s): Improvement in symptoms so as ready for discharge  Short Term Goals: Ability to demonstrate self-control will improve  Physician Treatment Plan for Secondary Diagnosis: Principal Problem:   Delusional disorder ([place]) Active Problems:   Opiate abuse, continuous ([place])  Long Term Goal(s): Improvement in symptoms so as ready for discharge  Short Term Goals: Ability to maintain clinical measurements within normal limits will improve  I certify that inpatient services furnished can reasonably be expected to improve the patient's condition.    Alethia Berthold, MD 10/2/20202:56 PM

## 2019-09-28 NOTE — ED Notes (Signed)
Hourly rounding reveals patient in room. No complaints, stable, in no acute distress. Q15 minute rounds and monitoring via Security Cameras to continue. 

## 2019-09-28 NOTE — ED Notes (Signed)
Hourly rounding reveals patient sleeping in room. No complaints, stable, in no acute distress. Q15 minute rounds and monitoring via Security Cameras to continue. 

## 2019-09-28 NOTE — [facility] Group Notes (Signed)
[place] Group Notes:  (Nursing/MHT/Case Management/Adjunct)  Date:  09/28/2019  Time:  9:29 PM  Type of Therapy:  Group Therapy  Participation Level:  Active  Participation Quality:  Appropriate  Affect:  Appropriate  Cognitive:  Appropriate  Insight:  Good  Engagement in Group:  Engaged  Modes of Intervention:  Support  Summary of Progress/Problems:  Andrew Tate 09/28/2019, 9:29 PM

## 2019-09-28 NOTE — Progress Notes (Signed)
Patient is admitted from [facility].Patient was not willing to cooperate with the admission process.Patient refused to let staff do the skin assessment.Checked pockets for contraband,no contraband found.Patient states "I am not the one need to be here,she is at home. I am not going to do anything here."Refusing to answer any questions.Denies SI,HI and AVH at this time.Oriented patient to the unit.Lunch offered.Patient refused.Support and encouragement offered,will continue monitor the patient.
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
## 2019-09-28 NOTE — Tx Team (Signed)
Initial Treatment Plan 09/28/2019 2:19 PM Andrew Tate [id number]    PATIENT STRESSORS: Marital or family conflict Medication change or noncompliance Substance abuse   PATIENT STRENGTHS: Average or above average intelligence Capable of independent living Communication skills Work skills   PATIENT IDENTIFIED PROBLEMS: Aggressive behavior  Noncompliant with medications.                   DISCHARGE CRITERIA:  Ability to meet basic life and health needs Adequate post-discharge living arrangements Medical problems require only outpatient monitoring  PRELIMINARY DISCHARGE PLAN: Return to previous living arrangement Return to previous work or school arrangements  PATIENT/FAMILY INVOLVEMENT: This treatment plan has been presented to and reviewed with the patient, Andrew Tate, and/or family member,  The patient and family have been given the opportunity to ask questions and make suggestions.  Merlene Morse, RN 09/28/2019, 2:19 PM

## 2019-09-28 NOTE — BH Assessment (Signed)
Patient is to be admitted to [facility] by Dr. Claris Gower.  Attending Physician will be Dr. Weber Cooks.   Patient has been assigned to room 310, by [place].   Intake Paper Work has been signed and placed on patient chart.   ER staff is aware of the admission:  Glenda, ER Secretary    Dr. Jacqualine Code, ER MD   Donneta Romberg, Patient's Nurse   Butch Penny, Patient Access.

## 2019-09-28 NOTE — ED Notes (Signed)
Patient transferred to [facility], patient received transfer papers with staff. Patient got belongings and verbalized he has received all of his belongings. Patient is very upset, had two officers and ED tech escort him, Denies SI/HI AVH. Patient refused Vital signs

## 2019-09-28 NOTE — BHH Group Notes (Signed)
LCSW Group Therapy Note  09/28/2019 11:51 AM  Type of Therapy and Topic:  Group Therapy:  Feelings around Relapse and Recovery  Participation Level:  Did Not Attend   Description of Group:    Patients in this group will discuss emotions they experience before and after a relapse. They will process how experiencing these feelings, or avoidance of experiencing them, relates to having a relapse. Facilitator will guide patients to explore emotions they have related to recovery. Patients will be encouraged to process which emotions are more powerful. They will be guided to discuss the emotional reaction significant others in their lives may have to their relapse or recovery. Patients will be assisted in exploring ways to respond to the emotions of others without this contributing to a relapse.  Therapeutic Goals: 1. Patient will identify two or more emotions that lead to a relapse for them 2. Patient will identify two emotions that result when they relapse 3. Patient will identify two emotions related to recovery 4. Patient will demonstrate ability to communicate their needs through discussion and/or role plays   Summary of Patient Progress: x    Therapeutic Modalities:   Cognitive Behavioral Therapy Solution-Focused Therapy Assertiveness Training Relapse Prevention Therapy   Evalina Field, MSW, LCSW Clinical Social Work 09/28/2019 11:51 AM

## 2019-09-29 LAB — HEPATIC FUNCTION PANEL
ALT: 21 U/L (ref 0–44)
AST: 17 U/L (ref 15–41)
Albumin: 4.6 g/dL (ref 3.5–5.0)
Alkaline Phosphatase: 65 U/L (ref 38–126)
Bilirubin, Direct: 0.2 mg/dL (ref 0.0–0.2)
Indirect Bilirubin: 1.4 mg/dL — ABNORMAL HIGH (ref 0.3–0.9)
Total Bilirubin: 1.6 mg/dL — ABNORMAL HIGH (ref 0.3–1.2)
Total Protein: 8.3 g/dL — ABNORMAL HIGH (ref 6.5–8.1)

## 2019-09-29 LAB — LIPID PANEL
Cholesterol: 208 mg/dL — ABNORMAL HIGH (ref 0–200)
HDL: 38 mg/dL — ABNORMAL LOW (ref >40)
LDL Cholesterol: 144 mg/dL — ABNORMAL HIGH (ref 0–99)
Total CHOL/HDL Ratio: 5.5 RATIO
Triglycerides: 131 mg/dL (ref <150)
VLDL: 26 mg/dL (ref 0–40)

## 2019-09-29 LAB — TSH: TSH: 1.314 u[IU]/mL (ref 0.350–4.500)

## 2019-09-29 MED ORDER — NICOTINE 21 MG/24HR TD PT24
21.0000 mg | MEDICATED_PATCH | Freq: Every day | TRANSDERMAL | Status: DC
  Administered 2019-09-30 – 2019-10-02 (×3): 21 mg via TRANSDERMAL
  Filled 2019-09-29 (×3): qty 1

## 2019-09-29 NOTE — Progress Notes (Signed)
Patient became more and more anxious and restless toward the evening. Complaining of nausea, and chills. BP 113/78, HR 84. CIWA 11. Per MD recommendation, patient was given Vistaril and Zofran. Patient came to the medication room and expressed his feelings. Discussed his substance abuse problem. Patient became more and more calm and cooperative as he expressed his feelings. Was encouraged to continue to report symptoms as presented. Safety precautions reinforced.

## 2019-09-29 NOTE — BHH Group Notes (Signed)
Type of Therapy and Topic:  Group Therapy:  Trust and Honesty 09/29/2019  Participation Level:  Active   Description of Group:     In this group patients will be asked to explore the value of being honest.  Patients will be guided to discuss their thoughts, feelings, and behaviors related to honesty and trusting in others. Patients will process together how trust and honesty relate to forming relationships with peers, family members, and self. Each patient will be challenged to identify and express feelings of being vulnerable. Patients will discuss reasons why people are dishonest and identify alternative outcomes if one was truthful (to self or others). This group will be process-oriented, with patients participating in exploration of their own experiences, giving and receiving support, and processing challenge from other group members.    Therapeutic Goals:  1.  Patient will identify why honesty is important to relationships and how honesty overall affects relationships.  2.  Patient will identify a situation where they lied or were lied too and the feelings, thought process, and behaviors surrounding the situation  3.  Patient will identify the meaning of being vulnerable, how that feels, and how that correlates to being honest with self and others.  4.  Patient will identify situations where they could have told the truth, but instead lied and explain reasons of dishonesty.     Summary of Patient Progress: Andrew Tate remained present throughout group and listened attentively to peers. He shared that he has been lied to recently. He shared that is it best to "forgive then forget."  Therapeutic Modalities:   Cognitive Behavioral Therapy Solution Focused Therapy Motivational Interviewing Brief Therapy

## 2019-09-29 NOTE — Progress Notes (Signed)
Patient came to med room to take night medicine and was compliant with medications. He requested something for sleep. Patient was given medications and returned back to his room. Went in to check and see how patient was after PRN medications and was resting in bed working on a word search. He said PRN medication was helping some. Patient was quiet, but conversational. When checked back in again, he was asleep in bed.

## 2019-09-29 NOTE — Progress Notes (Addendum)
[facility] MD Progress Note  09/29/2019 3:10 PM Andrew Tate  MRN:  [id number] Subjective:  39 yo male admitted for further evaluation of possible paranoid delusions regarding his wife.  His wife admitted to cheating in June 2020, but he states the affair continues to be ongoing; however, wife reportedly states the affair is over.  Pt states he has text message and audio recording evidence that his wife continues to have an affair.  He believes that she's being vindictive by having him committed.  Pt admits to using [facility] every other day since his teenage yrs.  He also admits to using opiate "strips" (illicit street use) for the past year.  Strips are likely subutex or suboxone. Pt states "I didn't know they had opioids in them."  Pt aware that drugs can make one paranoid, but he adamantly denies that he's paranoid.  He states that he plans to divorce his wife. Pt states they've been married for a little over 2 years and don't have any children together.  He denies SI, HI, AH, VH, and paranoia.  He denies withdrawal symptoms from the opioids except for "cold chills."  However, pt's room is cold today, so he's unsure if the cold chills are from that as well.    Principal Problem: Delusional disorder ([place]) Diagnosis: Principal Problem:   Delusional disorder ([place]) Active Problems:   Opiate abuse, continuous ([place])  Total Time spent with patient, reviewing the chart, and discussing plan of care with the patient and his treatment team: 30 minutes  Past Psychiatric History: see H&P  Past Medical History:  Past Medical History:  Diagnosis Date  . ADHD (attention deficit hyperactivity disorder)   . Anxiety     Past Surgical History:  Procedure Laterality Date  . SURGERY OF LIP  2006   spit gland removal   Family History:  Family History  Problem Relation Age of Onset  . Heart disease Mother   . Bipolar disorder Mother   . Kidney cancer Mother   . Asthma Brother    Family Psychiatric  History: see  H&P Social History:  Social History   Substance and Sexual Activity  Alcohol Use Never  . Frequency: Never     Social History   Substance and Sexual Activity  Drug Use Yes  . Types: Marijuana    Social History   Socioeconomic History  . Marital status: Single    Spouse name: Not on file  . Number of children: Not on file  . Years of education: Not on file  . Highest education level: Not on file  Occupational History  . Not on file  Social Needs  . Financial resource strain: Not on file  . Food insecurity    Worry: Not on file    Inability: Not on file  . Transportation needs    Medical: Not on file    Non-medical: Not on file  Tobacco Use  . Smoking status: Current Every Day Smoker    Packs/day: 0.50    Types: Cigarettes  . Smokeless tobacco: Never Used  Substance and Sexual Activity  . Alcohol use: Never    Frequency: Never  . Drug use: Yes    Types: Marijuana  . Sexual activity: Yes  Lifestyle  . Physical activity    Days per week: Not on file    Minutes per session: Not on file  . Stress: Not on file  Relationships  . Social connections    Talks on phone: Not on file  Gets together: Not on file    Attends religious service: Not on file    Active member of club or organization: Not on file    Attends meetings of clubs or organizations: Not on file    Relationship status: Not on file  Other Topics Concern  . Not on file  Social History Narrative  . Not on file   Additional Social History: see H&P                        Sleep: Fair  Appetite:  Good  Current Medications: Current Facility-Administered Medications  Medication Dose Route Frequency Provider Last Rate Last Dose  . acetaminophen (TYLENOL) tablet 650 mg  650 mg Oral Q6H PRN Cristofano, Dorene Ar, MD      . alum & mag hydroxide-simeth (MAALOX/MYLANTA) 200-200-20 MG/5ML suspension 30 mL  30 mL Oral Q4H PRN Cristofano, Paul A, MD      . cloNIDine (CATAPRES) tablet 0.1 mg  0.1 mg  Oral Q6H PRN Clapacs, John T, MD   0.1 mg at 09/28/19 2115  . hydrOXYzine (ATARAX/VISTARIL) tablet 50 mg  50 mg Oral TID PRN Clapacs, Madie Reno, MD   50 mg at 09/28/19 2114  . loperamide (IMODIUM) capsule 2 mg  2 mg Oral PRN Clapacs, John T, MD      . magnesium hydroxide (MILK OF MAGNESIA) suspension 30 mL  30 mL Oral Daily PRN Cristofano, Dorene Ar, MD      . methocarbamol (ROBAXIN) tablet 750 mg  750 mg Oral Q6H PRN Clapacs, John T, MD      . nicotine (NICODERM CQ - dosed in mg/24 hours) patch 14 mg  14 mg Transdermal Once Cristofano, Dorene Ar, MD   14 mg at 09/29/19 0800  . nicotine (NICODERM CQ - dosed in mg/24 hours) patch 21 mg  21 mg Transdermal Daily Clapacs, John T, MD      . ondansetron [facility]) tablet 4 mg  4 mg Oral Q8H PRN Clapacs, Madie Reno, MD        Lab Results:  Results for orders placed or performed during the hospital encounter of 09/28/19 (from the past 48 hour(s))  Hepatic function panel     Status: Abnormal   Collection Time: 09/29/19  6:27 AM  Result Value Ref Range   Total Protein 8.3 (H) 6.5 - 8.1 g/dL   Albumin 4.6 3.5 - 5.0 g/dL   AST 17 15 - 41 U/L   ALT 21 0 - 44 U/L   Alkaline Phosphatase 65 38 - 126 U/L   Total Bilirubin 1.6 (H) 0.3 - 1.2 mg/dL   Bilirubin, Direct 0.2 0.0 - 0.2 mg/dL   Indirect Bilirubin 1.4 (H) 0.3 - 0.9 mg/dL    Comment: Performed at [facility], [address] [place], [place] [address]  Lipid panel     Status: Abnormal   Collection Time: 09/29/19  6:27 AM  Result Value Ref Range   Cholesterol 208 (H) 0 - 200 mg/dL   Triglycerides 131 <150 mg/dL   HDL 38 (L) >40 mg/dL   Total CHOL/HDL Ratio 5.5 RATIO   VLDL 26 0 - 40 mg/dL   LDL Cholesterol 144 (H) 0 - 99 mg/dL    Comment:        Total Cholesterol/HDL:CHD Risk Coronary Heart Disease Risk Table                     Men   Women  1/2 Average Risk   3.4   3.3  Average Risk       5.0   4.4  2 X Average Risk   9.6   7.1  3 X Average Risk  23.4   11.0        Use the calculated  Patient Ratio above and the CHD Risk Table to determine the patient's CHD Risk.        ATP III CLASSIFICATION (LDL):  <100     mg/dL   Optimal  100-129  mg/dL   Near or Above                    Optimal  130-159  mg/dL   Borderline  160-189  mg/dL   High  >190     mg/dL   Very High Performed at [facility], [address] [place], [place] [address]   TSH     Status: None   Collection Time: 09/29/19  6:27 AM  Result Value Ref Range   TSH 1.314 0.350 - 4.500 uIU/mL    Comment: Performed by a 3rd Generation assay with a functional sensitivity of <=0.01 uIU/mL. Performed at [facility], [address] [place], [place] [address]     Blood Alcohol level:  Lab Results  Component Value Date   [facility] <10 [id number]    Metabolic Disorder Labs: No results found for: HGBA1C, MPG No results found for: PROLACTIN Lab Results  Component Value Date   CHOL 208 (H) 09/29/2019   TRIG 131 09/29/2019   HDL 38 (L) 09/29/2019   CHOLHDL 5.5 09/29/2019   VLDL 26 09/29/2019   LDLCALC 144 (H) 09/29/2019    Physical Findings: AIMS:  , ,  ,  ,    CIWA:    COWS:     Musculoskeletal: Strength & Muscle Tone: within normal limits Gait & Station: normal Patient leans: N/A  Psychiatric Specialty Exam: Physical Exam  Nursing note and vitals reviewed. Constitutional: He appears well-developed.  HENT:  Head: Normocephalic and atraumatic.  Neck: Normal range of motion.  Respiratory: Effort normal.  Musculoskeletal: Normal range of motion.  Neurological: He is alert.  Psychiatric: His speech is not rapid and/or pressured (but volume increased). He is not actively hallucinating. Thought content is paranoid (rule out) and delusional (rule out). He expresses impulsivity. He does not exhibit a depressed mood. He expresses no homicidal and no suicidal ideation.    Review of Systems  Constitutional: Negative.   HENT: Negative.   Eyes: Negative.   Respiratory: Negative.    Gastrointestinal: Negative.   Genitourinary: Negative.   Musculoskeletal: Negative.   Neurological: Negative.   Psychiatric/Behavioral: Positive for substance abuse. Negative for depression, hallucinations and suicidal ideas.    Blood pressure 101/74, pulse 70, temperature 98.6 F (37 C), temperature source Oral, resp. rate 16, height 5\' 11"  (1.803 m), weight 108.9 kg, SpO2 98 %.Body mass index is 33.47 kg/m.  General Appearance: Casual  Eye Contact:  Good  Speech:  increased volume, but normal rate  Volume:  Increased  Mood:  Irritable  Affect:  Congruent  Thought Process:  Descriptions of Associations: Circumstantial  Orientation:  Full (Time, Place, and Person)  Thought Content:  rule out paranoid ideation--pt believes his wife is having an ongoing affair; however, wife denies it  Suicidal Thoughts:  pt denies  Homicidal Thoughts:  pt denies  Memory:  not formally tested today, but appears grossly intact  Judgement:  Poor  Insight:  Shallow  Psychomotor Activity:  Increased mildly  Concentration:  Concentration: Fair and Attention Span: Fair  Recall:  not formally tested, but appears grossly intact  Fund of Knowledge:  Fair  Language:  Fair  Akathisia:  NA  Handed:  Right  AIMS (if indicated):     Assets:  Housing Physical Health  ADL's:  Intact  Cognition:  WNL  Sleep:  Number of Hours: 7.75     Treatment Plan Summary: Daily contact with patient to assess and evaluate symptoms and progress in treatment, Medication management and Plan Monitor for withdrawal from opiates.  Continue current PRN medications.  Pt declines scheduled medications.  He states he's not paranoid or delusional and he doesn't believe he needs to be here.  He believes his wife is being vindictive by having him committed.  He states he plans to divorce his wife.  Encourage participation in the therapeutic milieu of the unit. Perhaps once the [facility] and opioids are out of his system, then the possible  paranoid delusions will resolve.  Will continue to monitor. UDS positive for benzodiazepines, opiates, and cannabis  Tennis Ship, MD 09/29/2019, 3:10 PM
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
## 2019-09-30 DIAGNOSIS — F1199 Opioid use, unspecified with unspecified opioid-induced disorder: Secondary | ICD-10-CM

## 2019-09-30 DIAGNOSIS — F1220 Cannabis dependence, uncomplicated: Secondary | ICD-10-CM

## 2019-09-30 NOTE — Progress Notes (Signed)
[facility] MD Progress Note  09/30/2019 5:45 PM Andrew Tate  MRN:  [id number] Subjective:  38 yo male admitted for further evaluation of possible paranoid delusions regarding his wife.  His wife admitted to cheating in June 2020, but he states the affair continues to be ongoing; however, wife reportedly states the affair is over.  Pt states he has text message and audio recording evidence that his wife continues to have an affair.  He believes that she's being vindictive by having him committed.  Pt admits to using [facility] every other day since his teenage yrs.  He also admits to using opiate "strips" (illicit street use) for the past year.  Strips are likely subutex or suboxone. Pt stated "I didn't know they had opioids in them, " and he doesn't believe he has an illicit drug problem.   Pt started having withdrawals from opiates yesterday evening and he has some mild withdrawal symptoms today.  He complained of muscle aches relieved with prn robaxin.  He also complained of diarrhea and nausea.  Imodium and zofran are available-pt is aware.  Pt states he would like to obtain the suboxone legally, but his insurance doesn't cover it.    Pt discusses that he plans to divorce his wife.  He states he spoke to her briefly upon admission and told her to pack up his belongings b/c he's moving out.  He plans to stay with his mother or brother.  Pt is hopeful he won't lose his job at the [employer].   Pt denies SI, HI, AH, VH.  He denies thoughts to kill or hurt his wife.     Principal Problem: Delusional disorder ([place]) Diagnosis: Principal Problem:   Delusional disorder ([place]) Active Problems:   Opiate abuse, continuous ([place])  Total Time spent with patient, reviewing the chart, and discussing plan of care with the patient and his treatment team: 25 min  Past Psychiatric History: see H&P  Past Medical History:  Past Medical History:  Diagnosis Date  . ADHD (attention deficit hyperactivity  disorder)   . Anxiety     Past Surgical History:  Procedure Laterality Date  . SURGERY OF LIP  2006   spit gland removal   Family History:  Family History  Problem Relation Age of Onset  . Heart disease Mother   . Bipolar disorder Mother   . Kidney cancer Mother   . Asthma Brother    Family Psychiatric  History: see H&P Social History:  Social History   Substance and Sexual Activity  Alcohol Use Never  . Frequency: Never     Social History   Substance and Sexual Activity  Drug Use Yes  . Types: Marijuana    Social History   Socioeconomic History  . Marital status: Single    Spouse name: Not on file  . Number of children: Not on file  . Years of education: Not on file  . Highest education level: Not on file  Occupational History  . Not on file  Social Needs  . Financial resource strain: Not on file  . Food insecurity    Worry: Not on file    Inability: Not on file  . Transportation needs    Medical: Not on file    Non-medical: Not on file  Tobacco Use  . Smoking status: Current Every Day Smoker    Packs/day: 0.50    Types: Cigarettes  . Smokeless tobacco: Never Used  Substance and Sexual Activity  . Alcohol use: Never  Frequency: Never  . Drug use: Yes    Types: Marijuana  . Sexual activity: Yes  Lifestyle  . Physical activity    Days per week: Not on file    Minutes per session: Not on file  . Stress: Not on file  Relationships  . Social Herbalist on phone: Not on file    Gets together: Not on file    Attends religious service: Not on file    Active member of club or organization: Not on file    Attends meetings of clubs or organizations: Not on file    Relationship status: Not on file  Other Topics Concern  . Not on file  Social History Narrative  . Not on file   Additional Social History: see H&P                        Sleep: Fair  Appetite:  Good  Current Medications: Current Facility-Administered  Medications  Medication Dose Route Frequency Provider Last Rate Last Dose  . acetaminophen (TYLENOL) tablet 650 mg  650 mg Oral Q6H PRN Cristofano, Dorene Ar, MD      . alum & mag hydroxide-simeth (MAALOX/MYLANTA) 200-200-20 MG/5ML suspension 30 mL  30 mL Oral Q4H PRN Cristofano, Paul A, MD      . cloNIDine (CATAPRES) tablet 0.1 mg  0.1 mg Oral Q6H PRN Clapacs, John T, MD   0.1 mg at 09/30/19 0907  . hydrOXYzine (ATARAX/VISTARIL) tablet 50 mg  50 mg Oral TID PRN Clapacs, Madie Reno, MD   50 mg at 09/29/19 1836  . loperamide (IMODIUM) capsule 2 mg  2 mg Oral PRN Clapacs, John T, MD      . magnesium hydroxide (MILK OF MAGNESIA) suspension 30 mL  30 mL Oral Daily PRN Cristofano, Paul A, MD      . methocarbamol (ROBAXIN) tablet 750 mg  750 mg Oral Q6H PRN Clapacs, Madie Reno, MD   750 mg at 09/30/19 0907  . nicotine (NICODERM CQ - dosed in mg/24 hours) patch 21 mg  21 mg Transdermal Daily Clapacs, John T, MD   21 mg at 09/30/19 0900  . ondansetron (ZOFRAN) tablet 4 mg  4 mg Oral Q8H PRN Clapacs, Madie Reno, MD   4 mg at 09/29/19 1836    Lab Results:  Results for orders placed or performed during the hospital encounter of 09/28/19 (from the past 48 hour(s))  Hepatic function panel     Status: Abnormal   Collection Time: 09/29/19  6:27 AM  Result Value Ref Range   Total Protein 8.3 (H) 6.5 - 8.1 g/dL   Albumin 4.6 3.5 - 5.0 g/dL   AST 17 15 - 41 U/L   ALT 21 0 - 44 U/L   Alkaline Phosphatase 65 38 - 126 U/L   Total Bilirubin 1.6 (H) 0.3 - 1.2 mg/dL   Bilirubin, Direct 0.2 0.0 - 0.2 mg/dL   Indirect Bilirubin 1.4 (H) 0.3 - 0.9 mg/dL    Comment: Performed at [facility], [address] [place], [place] [address]  Lipid panel     Status: Abnormal   Collection Time: 09/29/19  6:27 AM  Result Value Ref Range   Cholesterol 208 (H) 0 - 200 mg/dL   Triglycerides 131 <150 mg/dL   HDL 38 (L) >40 mg/dL   Total CHOL/HDL Ratio 5.5 RATIO   VLDL 26 0 - 40 mg/dL   LDL Cholesterol 144 (H) 0 - 99  mg/dL     Comment:        Total Cholesterol/HDL:CHD Risk Coronary Heart Disease Risk Table                     Men   Women  1/2 Average Risk   3.4   3.3  Average Risk       5.0   4.4  2 X Average Risk   9.6   7.1  3 X Average Risk  23.4   11.0        Use the calculated Patient Ratio above and the CHD Risk Table to determine the patient's CHD Risk.        ATP III CLASSIFICATION (LDL):  <100     mg/dL   Optimal  100-129  mg/dL   Near or Above                    Optimal  130-159  mg/dL   Borderline  160-189  mg/dL   High  >190     mg/dL   Very High Performed at [facility], [address] [place], [place] [address]   TSH     Status: None   Collection Time: 09/29/19  6:27 AM  Result Value Ref Range   TSH 1.314 0.350 - 4.500 uIU/mL    Comment: Performed by a 3rd Generation assay with a functional sensitivity of <=0.01 uIU/mL. Performed at [facility], [address] [place], [place] [address]     Blood Alcohol level:  Lab Results  Component Value Date   [facility] <10 123XX123    Metabolic Disorder Labs: No results found for: HGBA1C, MPG No results found for: PROLACTIN Lab Results  Component Value Date   CHOL 208 (H) 09/29/2019   TRIG 131 09/29/2019   HDL 38 (L) 09/29/2019   CHOLHDL 5.5 09/29/2019   VLDL 26 09/29/2019   LDLCALC 144 (H) 09/29/2019    Physical Findings: AIMS:  , ,  ,  ,    CIWA:  CIWA-Ar Total: 11 COWS:     Musculoskeletal: Strength & Muscle Tone: within normal limits Gait & Station: normal Patient leans: N/A  Psychiatric Specialty Exam: Physical Exam  Nursing note and vitals reviewed. Constitutional: He appears well-developed.  HENT:  Head: Normocephalic and atraumatic.  Neck: Normal range of motion.  Respiratory: Effort normal.  Musculoskeletal: Normal range of motion.  Neurological: He is alert.  Psychiatric: His speech is not rapid and/or pressured (but volume increased). He is not actively hallucinating. Thought content is  paranoid (rule out) and delusional (rule out). He expresses impulsivity. He does not exhibit a depressed mood. He expresses no homicidal and no suicidal ideation.    Review of Systems  Constitutional: Negative.   HENT: Negative.   Eyes: Negative.   Respiratory: Negative.   Gastrointestinal: Negative.   Genitourinary: Negative.   Musculoskeletal: Negative.   Neurological: Negative.   Psychiatric/Behavioral: Positive for substance abuse. Negative for depression, hallucinations and suicidal ideas.    Blood pressure 118/82, pulse 70, temperature 98.2 F (36.8 C), temperature source Oral, resp. rate 18, height 5\' 11"  (1.803 m), weight 108.9 kg, SpO2 99 %.Body mass index is 33.47 kg/m.  General Appearance: Casual  Eye Contact:  Good  Speech:  increased volume, but normal rate  Volume:  Increased  Mood:  "good"  Affect:  engaged, but appears annoyed that he's hospitalized  Thought Process:  Descriptions of Associations: Circumstantial  Orientation:  Full (Time, Place,  and Person)  Thought Content:  rule out paranoid ideation--pt believes his wife is having an ongoing affair; however, wife denies it  Suicidal Thoughts:  pt denies  Homicidal Thoughts:  pt denies  Memory:  not formally tested today, but appears grossly intact  Judgement:  Poor  Insight:  Shallow  Psychomotor Activity:  Increased mildly  Concentration:  Concentration: Fair and Attention Span: Fair  Recall:  not formally tested, but appears grossly intact  Fund of Knowledge:  Fair  Language:  Fair  Akathisia:  NA  Handed:  Right  AIMS (if indicated):     Assets:  Housing Physical Health  ADL's:  Intact  Cognition:  WNL  Sleep:  Number of Hours: 7.75     Treatment Plan Summary: Daily contact with patient to assess and evaluate symptoms and progress in treatment, Medication management and Plan Monitor for withdrawal from opiates.  Continue current PRN medications.  Pt declines scheduled medications.  He states he's  not paranoid or delusional and he doesn't believe he needs to be here.  He believes his wife is being vindictive by having him committed.  He states he plans to divorce his wife.  Encourage participation in the therapeutic milieu of the unit. Perhaps once the [facility] and opioids are out of his system, then the possible paranoid delusions will resolve.  Will continue to monitor. UDS positive for benzodiazepines, opiates, and cannabis PRNs provided to mitigate withdrawal symptoms.   Tennis Ship, MD 09/30/2019, 5:45 PM

## 2019-09-30 NOTE — Plan of Care (Signed)
Patient is calm upon approach and adjusting well in the unit, has been taking his medications and cooperating with care regimen, denies any SI/HI/AVH . Maintaining  safety in unit, thought process is organized and responding logically, education and support is provided to engage in group activities and t participate in scheduled activities , patient acknowledged information provided , no distress , 15 minutes safety checks is maintained.   Problem: Education: Goal: Knowledge of [place] [facility] Education information/materials will improve Outcome: Progressing   Problem: Education: Goal: Knowledge of disease or condition will improve Outcome: Progressing Goal: Understanding of discharge needs will improve Outcome: Progressing   Problem: Health Behavior/Discharge Planning: Goal: Ability to identify changes in lifestyle to reduce recurrence of condition will improve Outcome: Progressing Goal: Identification of resources available to assist in meeting health care needs will improve Outcome: Progressing   Problem: Physical Regulation: Goal: Complications related to the disease process, condition or treatment will be avoided or minimized Outcome: Progressing   Problem: Safety: Goal: Ability to remain free from injury will improve Outcome: Progressing

## 2019-09-30 NOTE — [facility] Group Notes (Signed)
LCSW Group Therapy Note 09/30/2019 1:15pm  Type of Therapy and Topic: Group Therapy: Feelings Around Returning Home & Establishing a Supportive Framework and Supporting Oneself When Supports Not Available  Participation Level: Did Not Attend  Description of Group:  Patients first processed thoughts and feelings about upcoming discharge. These included fears of upcoming changes, lack of change, new living environments, judgements and expectations from others and overall stigma of mental health issues. The group then discussed the definition of a supportive framework, what that looks and feels like, and how do to discern it from an unhealthy non-supportive network. The group identified different types of supports as well as what to do when your family/friends are less than helpful or unavailable  Therapeutic Goals  1. Patient will identify one healthy supportive network that they can use at discharge. 2. Patient will identify one factor of a supportive framework and how to tell it from an unhealthy network. 3. Patient able to identify one coping skill to use when they do not have positive supports from others. 4. Patient will demonstrate ability to communicate their needs through discussion and/or role plays.  Summary of Patient Progress:  Pt was invited to attend group but chose not to attend. CSW will continue to encourage pt to attend group throughout their admission.    Therapeutic Modalities Cognitive Behavioral Therapy Motivational Interviewing   Andrew Tate  CUEBAS-COLON, LCSW 09/30/2019 1:06 PM

## 2019-09-30 NOTE — Plan of Care (Signed)
D- Patient alert and oriented. Patient presented in a pleasant mood on assessment stating that he didn't sleep well last night and that he wasn't feeling too well because he is going through opiate withdrawals. Patient complained of muscle spasms in bilateral legs, irritability, and runny nose, in which this writer administered PRN medication to help with these symptoms. Patient denies any signs/symptoms of depression/anxiety to this Probation officer. Patient also denied SI, HI, AVH, and pain at this time. Patient's goal for today is "going home", in which he will do "whatever" in order to accomplish his goal.  A- Scheduled medications administered to patient, per MD orders. Support and encouragement provided.  Routine safety checks conducted every 15 minutes.  Patient informed to notify staff with problems or concerns.  R- No adverse drug reactions noted. Patient contracts for safety at this time. Patient compliant with medications and treatment plan. Patient receptive, calm, and cooperative. Patient interacts well with others on the unit.  Patient remains safe at this time.  Problem: Education: Goal: Knowledge of [place] [facility] Education information/materials will improve Outcome: Progressing   Problem: Education: Goal: Knowledge of disease or condition will improve Outcome: Progressing Goal: Understanding of discharge needs will improve Outcome: Progressing   Problem: Health Behavior/Discharge Planning: Goal: Ability to identify changes in lifestyle to reduce recurrence of condition will improve Outcome: Progressing Goal: Identification of resources available to assist in meeting health care needs will improve Outcome: Progressing   Problem: Physical Regulation: Goal: Complications related to the disease process, condition or treatment will be avoided or minimized Outcome: Progressing   Problem: Safety: Goal: Ability to remain free from injury will improve Outcome: Progressing

## 2019-10-01 ENCOUNTER — Telehealth: Payer: Self-pay | Admitting: Family Medicine

## 2019-10-01 MED ORDER — TRAZODONE HCL 50 MG PO TABS
150.0000 mg | ORAL_TABLET | Freq: Every evening | ORAL | Status: DC | PRN
  Administered 2019-10-01: 150 mg via ORAL
  Filled 2019-10-01: qty 1

## 2019-10-01 NOTE — Progress Notes (Signed)
CSW received a call from [place] with [place] who reported that she works with pt and can assist CSW team with discharge planning if needed. She provided her phone number [phone]. CSW inquired if Kin knew pts provider as he was unsure of the name or agency, she reported seeing a Merrie Roof listed with phone number [phone].  Evalina Field, MSW, LCSW Clinical Social Work 10/01/2019 11:37 AM

## 2019-10-01 NOTE — Progress Notes (Addendum)
CSW spoke with pt regarding discharge plan and follow up. Pt reported that he has a psychiatry appointment scheduled on 12/11/19 but was unsure who the provider was. Pt reported that his PCP referred him to the psychiatrist due to him having to be referred in order for his insurance to pay for it.Pt reported he goes to [facility].   CSW contact [facility] who reported pt has been referred to Dr. Nicolasa Ducking and provided CSW with the contact info. CSW contacted Dr Varney Biles office who confirmed pts appointment on 12/12/19 at [facility]. They reported pt is on the cancellation list and if an earlier appointment becomes available they would call him. CSW then received a call back from Dr Varney Biles office stating there was a cancellation for 10/04/19 at 10AM and pts appointment has been moved to that date. CSW received another call back from Dr. Nicolasa Ducking office stating that pt cannot be seen there due to substance use diagnosis.   CSW contacted Orson Gear with [employer] and left a voicemail requesting a call back regarding pts discharge plan.  CSW contacted [facility] and spoke with Minette Brine to inform her that pt cannot be seen with Dr. Nicolasa Ducking due to [facility]. Minette Brine reported she would put a note in for pts doctor and they will give CSW a call back.   Evalina Field, MSW, LCSW Clinical Social Work 10/01/2019 2:37 PM

## 2019-10-01 NOTE — Plan of Care (Signed)
Patient is alert and oriented  X 4 stable and doing good in the unit , have not complained , states sleep is restful , patient has adequate coping skills , states moderate high energy , denies SI/HI/AVH, engaging appropriately , expresses no concerns , has positive mood and affect,.has not requested any PRNs , sleep is good no distress , safety is maintained.   Problem: Education: Goal: Knowledge of [place] [facility] Education information/materials will improve Outcome: Progressing   Problem: Education: Goal: Knowledge of disease or condition will improve Outcome: Progressing Goal: Understanding of discharge needs will improve Outcome: Progressing   Problem: Health Behavior/Discharge Planning: Goal: Ability to identify changes in lifestyle to reduce recurrence of condition will improve Outcome: Progressing Goal: Identification of resources available to assist in meeting health care needs will improve Outcome: Progressing   Problem: Physical Regulation: Goal: Complications related to the disease process, condition or treatment will be avoided or minimized Outcome: Progressing   Problem: Safety: Goal: Ability to remain free from injury will improve Outcome: Progressing

## 2019-10-01 NOTE — Progress Notes (Signed)
Recreation Therapy Notes  INPATIENT RECREATION THERAPY ASSESSMENT  Patient Details Name: Andrew Tate MRN: [id number] DOB: 13-Oct-1981 Today's Date: 10/01/2019       Information Obtained From: Patient  Able to Participate in Assessment/Interview: Yes  Patient Presentation: Responsive  Reason for Admission (Per Patient): Active Symptoms, Substance Abuse  Patient Stressors: Relationship  Coping Skills:   Talk  Leisure Interests (2+):  [employer], Nature - Location manager, Therapist, music - [employer] of Recreation/Participation: Financial risk analyst Resources:     [employer]:     Current Use:    If no, Barriers?:    Expressed Interest in [place] of [place]idence:  Insurance underwriter  Patient Main Form of Transportation: Musician  Patient Strengths:  loyal, getting along with others  Patient Identified Areas of Improvement:  Cope with things better  Patient Goal for Hospitalization:  Get on a substance abuse plan  Current SI (including self-harm):  No  Current HI:  No  Current AVH: No  Staff Intervention Plan: Group Attendance, Collaborate with Interdisciplinary Treatment Team  Consent to Intern Participation: N/A  Andrew Tate 10/01/2019, 3:41 PM

## 2019-10-01 NOTE — Progress Notes (Signed)
Recreation Therapy Notes    Date: 10/01/2019  Time: 9:30 am  Location: Craft Room  Behavioral response: Appropriate   Intervention Topic: Leisure  Discussion/Intervention:  Group content today was focused on leisure. The group defined what leisure is and some positive leisure activities they participate in. Individuals identified the difference between good and bad leisure. Participants expressed how they feel after participating in the leisure of their choice. The group discussed how they go about picking a leisure activity and if others are involved in their leisure activities. The patient stated how many leisure activities they have to choose from and reasons why it is important to have leisure time. Individuals participated in the intervention "Movie Madness" where they had a chance to identify new leisure activities as well as benefits of leisure. Clinical Observations/Feedback:  Patient attended group and explained that he participates in leisure because it relives stress. Individual was social with peers and staff while participating in the intervention.   Nozomi Mettler LRT/CTRS          Gaberial Cada 10/01/2019 11:45 AM

## 2019-10-01 NOTE — Plan of Care (Signed)
D- Patient alert and oriented. Patient presents in a pleasant mood on assessment stating that he slept "good" last night and reported that he is feeling much better than he was yesterday in regards to his opiate withdrawal. Patient denies SI, HI, AVH, and pain at this time. Patient also denies any signs/symptoms of depression/anxiety. Patient's goal for today is "going home", in which he will do "whatever" in order to accomplish his goal.  A- Scheduled medications administered to patient, per MD orders. Support and encouragement provided.  Routine safety checks conducted every 15 minutes.  Patient informed to notify staff with problems or concerns.  R- No adverse drug reactions noted. Patient contracts for safety at this time. Patient compliant with medications and treatment plan. Patient receptive, calm, and cooperative. Patient interacts well with others on the unit.  Patient remains safe at this time.  Problem: Education: Goal: Knowledge of [place] [facility] Education information/materials will improve Outcome: Progressing   Problem: Education: Goal: Knowledge of disease or condition will improve Outcome: Progressing Goal: Understanding of discharge needs will improve Outcome: Progressing   Problem: Health Behavior/Discharge Planning: Goal: Ability to identify changes in lifestyle to reduce recurrence of condition will improve Outcome: Progressing Goal: Identification of resources available to assist in meeting health care needs will improve Outcome: Progressing   Problem: Physical Regulation: Goal: Complications related to the disease process, condition or treatment will be avoided or minimized Outcome: Progressing   Problem: Safety: Goal: Ability to remain free from injury will improve Outcome: Progressing

## 2019-10-01 NOTE — Progress Notes (Signed)
[facility] MD Progress Note  10/01/2019 5:51 PM Andrew Tate  MRN:  [id number] Subjective: Follow-up for this patient with delusions or paranoia.  Patient's affect is much calmer today.  He still believes that his wife had been having affairs but expresses no hostility or homicidal ideation towards her or anyone else.  Is sleeping okay.  Taking care of his ADLs.  Getting along with other patients without any bizarre behavior.  Denies suicidal ideation.  Able to articulate calm and lucid plans for the future. Principal Problem: Delusional disorder ([place]) Diagnosis: Principal Problem:   Delusional disorder ([place]) Active Problems:   Opiate abuse, continuous ([place])  Total Time spent with patient: 30 minutes  Past Psychiatric History: Past history of opiate use  Past Medical History:  Past Medical History:  Diagnosis Date  . ADHD (attention deficit hyperactivity disorder)   . Anxiety     Past Surgical History:  Procedure Laterality Date  . SURGERY OF LIP  2006   spit gland removal   Family History:  Family History  Problem Relation Age of Onset  . Heart disease Mother   . Bipolar disorder Mother   . Kidney cancer Mother   . Asthma Brother    Family Psychiatric  History: See previous Social History:  Social History   Substance and Sexual Activity  Alcohol Use Never  . Frequency: Never     Social History   Substance and Sexual Activity  Drug Use Yes  . Types: Marijuana    Social History   Socioeconomic History  . Marital status: Single    Spouse name: Not on file  . Number of children: Not on file  . Years of education: Not on file  . Highest education level: Not on file  Occupational History  . Not on file  Social Needs  . Financial resource strain: Not on file  . Food insecurity    Worry: Not on file    Inability: Not on file  . Transportation needs    Medical: Not on file    Non-medical: Not on file  Tobacco Use  . Smoking status: Current Every Day Smoker     Packs/day: 0.50    Types: Cigarettes  . Smokeless tobacco: Never Used  Substance and Sexual Activity  . Alcohol use: Never    Frequency: Never  . Drug use: Yes    Types: Marijuana  . Sexual activity: Yes  Lifestyle  . Physical activity    Days per week: Not on file    Minutes per session: Not on file  . Stress: Not on file  Relationships  . Social Herbalist on phone: Not on file    Gets together: Not on file    Attends religious service: Not on file    Active member of club or organization: Not on file    Attends meetings of clubs or organizations: Not on file    Relationship status: Not on file  Other Topics Concern  . Not on file  Social History Narrative  . Not on file   Additional Social History:                         Sleep: Fair  Appetite:  Fair  Current Medications: Current Facility-Administered Medications  Medication Dose Route Frequency Provider Last Rate Last Dose  . acetaminophen (TYLENOL) tablet 650 mg  650 mg Oral Q6H PRN Cristofano, Dorene Ar, MD      . alum &  mag hydroxide-simeth (MAALOX/MYLANTA) 200-200-20 MG/5ML suspension 30 mL  30 mL Oral Q4H PRN Cristofano, Paul A, MD      . cloNIDine (CATAPRES) tablet 0.1 mg  0.1 mg Oral Q6H PRN Clapacs, John T, MD   0.1 mg at 09/30/19 0907  . hydrOXYzine (ATARAX/VISTARIL) tablet 50 mg  50 mg Oral TID PRN Clapacs, Madie Reno, MD   50 mg at 09/29/19 1836  . loperamide (IMODIUM) capsule 2 mg  2 mg Oral PRN Clapacs, John T, MD      . magnesium hydroxide (MILK OF MAGNESIA) suspension 30 mL  30 mL Oral Daily PRN Cristofano, Paul A, MD      . methocarbamol (ROBAXIN) tablet 750 mg  750 mg Oral Q6H PRN Clapacs, Madie Reno, MD   750 mg at 09/30/19 0907  . nicotine (NICODERM CQ - dosed in mg/24 hours) patch 21 mg  21 mg Transdermal Daily Clapacs, Madie Reno, MD   21 mg at 10/01/19 0737  . ondansetron (ZOFRAN) tablet 4 mg  4 mg Oral Q8H PRN Clapacs, Madie Reno, MD   4 mg at 09/29/19 1836    Lab Results: No results found  for this or any previous visit (from the past 48 hour(s)).  Blood Alcohol level:  Lab Results  Component Value Date   ETH <10 123XX123    Metabolic Disorder Labs: No results found for: HGBA1C, MPG No results found for: PROLACTIN Lab Results  Component Value Date   CHOL 208 (H) 09/29/2019   TRIG 131 09/29/2019   HDL 38 (L) 09/29/2019   CHOLHDL 5.5 09/29/2019   VLDL 26 09/29/2019   LDLCALC 144 (H) 09/29/2019    Physical Findings: AIMS:  , ,  ,  ,    CIWA:  CIWA-Ar Total: 11 COWS:     Musculoskeletal: Strength & Muscle Tone: within normal limits Gait & Station: normal Patient leans: N/A  Psychiatric Specialty Exam: Physical Exam  Nursing note and vitals reviewed. Constitutional: He appears well-developed and well-nourished.  HENT:  Head: Normocephalic and atraumatic.  Eyes: Pupils are equal, round, and reactive to light. Conjunctivae are normal.  Neck: Normal range of motion.  Cardiovascular: Regular rhythm and normal heart sounds.  Respiratory: Effort normal.  GI: Soft.  Musculoskeletal: Normal range of motion.  Neurological: He is alert.  Skin: Skin is warm and dry.  Psychiatric: He has a normal mood and affect. His speech is normal and behavior is normal. Judgment and thought content normal. Cognition and memory are normal.    Review of Systems  Constitutional: Negative.   HENT: Negative.   Eyes: Negative.   Respiratory: Negative.   Cardiovascular: Negative.   Gastrointestinal: Negative.   Musculoskeletal: Negative.   Skin: Negative.   Neurological: Negative.   Psychiatric/Behavioral: Negative.     Blood pressure 116/83, pulse 97, temperature 98.2 F (36.8 C), temperature source Oral, resp. rate 18, height 5\' 11"  (1.803 m), weight 108.9 kg, SpO2 93 %.Body mass index is 33.47 kg/m.  General Appearance: Casual  Eye Contact:  Good  Speech:  Clear and Coherent  Volume:  Normal  Mood:  Euthymic  Affect:  Congruent  Thought Process:  Goal Directed   Orientation:  Full (Time, Place, and Person)  Thought Content:  Logical  Suicidal Thoughts:  No  Homicidal Thoughts:  No  Memory:  Immediate;   Fair Recent;   Fair Remote;   Fair  Judgement:  Fair  Insight:  Fair  Psychomotor Activity:  Decreased  Concentration:  Concentration: Fair  Recall:  Smiley Houseman of Knowledge:  Fair  Language:  Fair  Akathisia:  No  Handed:  Right  AIMS (if indicated):     Assets:  Desire for Improvement Housing Physical Health  ADL's:  Intact  Cognition:  WNL  Sleep:  Number of Hours: 6     Treatment Plan Summary: Daily contact with patient to assess and evaluate symptoms and progress in treatment, Medication management and Plan Patient has continued to decline antipsychotic medication.  Condition no worse if anything seems to be doing better.  Not agitated not aggressive.  Showing good thought organization.  Patient at this point no longer meets commitment criteria.  Plan for likely discharge tomorrow.  We are working on recommended outpatient psychiatric follow-up.  Alethia Berthold, MD 10/01/2019, 5:51 PM

## 2019-10-01 NOTE — Telephone Encounter (Signed)
Had sent a telephone message several days ago to Referrals dept asking about re-routing ASAP but never heard back. Will re-route and see if there's been any updates  Copied from [place] [phone]. Topic: General - Other >> Oct 01, 2019  2:45 PM Celene Kras A wrote: Reason for CRM: Minette Brine, from [facility], called stating the referral to psychiatrist that was made for pt was denied due to pts history of substance abuse. Minette Brine is requesting to know if another referral can be placed before he is discharged. Please advise.   [phone]

## 2019-10-01 NOTE — BHH Group Notes (Signed)
LCSW Group Therapy Note   10/01/2019 1:00 PM  Type of Therapy and Topic:  Group Therapy:  Overcoming Obstacles   Participation Level:  Active   Description of Group:    In this group patients will be encouraged to explore what they see as obstacles to their own wellness and recovery. They will be guided to discuss their thoughts, feelings, and behaviors related to these obstacles. The group will process together ways to cope with barriers, with attention given to specific choices patients can make. Each patient will be challenged to identify changes they are motivated to make in order to overcome their obstacles. This group will be process-oriented, with patients participating in exploration of their own experiences as well as giving and receiving support and challenge from other group members.   Therapeutic Goals: 1. Patient will identify personal and current obstacles as they relate to admission. 2. Patient will identify barriers that currently interfere with their wellness or overcoming obstacles.  3. Patient will identify feelings, thought process and behaviors related to these barriers. 4. Patient will identify two changes they are willing to make to overcome these obstacles:      Summary of Patient Progress Patient was an active participant in group.  Patient shared how he has felt guilt, anger and conflicted over the obstacles in his life.  He shares how he likes to keep busy to cope.  Patient reports plans to develop a hobby like gardening.     Therapeutic Modalities:   Cognitive Behavioral Therapy Solution Focused Therapy Motivational Interviewing Relapse Prevention Therapy  Assunta Curtis, MSW, LCSW 10/01/2019 12:31 PM

## 2019-10-02 ENCOUNTER — Ambulatory Visit: Payer: Self-pay | Admitting: Podiatry

## 2019-10-02 MED ORDER — NICOTINE 21 MG/24HR TD PT24: 21.0000 mg | MEDICATED_PATCH | Freq: Every day | TRANSDERMAL | 0 refills | Status: DC

## 2019-10-02 NOTE — Progress Notes (Signed)
  [facility] Adult Case Management Discharge Plan :  Will you be returning to the same living situation after discharge:  No. Pt reports he is moving to [place] At discharge, do you have transportation home?: Yes,  will call friend Do you have the ability to pay for your medications: Yes,  BCBS insurance  Release of information consent forms completed and in the chart;    Patient to Follow up at: Follow-up [place] Follow up on 10/04/2019.   Why: You have a telemed appointment scheduled with Pascal Lux on Thursday 10/04/19 at 8:00AM. Please contact the office if you have any questions. Thank You! Contact information: [address] [place] [address] [phone]           Next level of care provider has access to [place] and Suicide Prevention discussed: Yes,  SPE completed with pt as pt declined collateral contact  Have you used any form of tobacco in the last 30 days? (Cigarettes, Smokeless Tobacco, Cigars, and/or Pipes): Yes  Has patient been referred to the Quitline?: Patient refused referral  Patient has been referred for addiction treatment: [place], LCSW 10/02/2019, 10:17 AM

## 2019-10-02 NOTE — Progress Notes (Addendum)
Patient ID: Andrew Tate, male   DOB: [birth year], 38 y.o.   MRN: [id number]  Discharge Note:  Patient denies SI/HI/AVH at this time. Discharge instructions, AVS, prescriptions, and transition record gone over with patient. Patient agrees to comply with medication management, follow-up visit, and outpatient therapy. Patient belongings returned to patient. Patient questions and concerns addressed and answered. Patient ambulatory off unit. Patient discharged to home with his wife.

## 2019-10-02 NOTE — BHH Suicide Risk Assessment (Signed)
[facility] Discharge Suicide Risk Assessment   Principal Problem: Delusional disorder [facility]) Discharge Diagnoses: Principal Problem:   Delusional disorder ([place]) Active Problems:   Opiate abuse, continuous ([place])   Total Time spent with patient: 30 minutes  Musculoskeletal: Strength & Muscle Tone: within normal limits Gait & Station: normal Patient leans: N/A  Psychiatric Specialty Exam: Review of Systems  Constitutional: Negative.   HENT: Negative.   Eyes: Negative.   Respiratory: Negative.   Cardiovascular: Negative.   Gastrointestinal: Negative.   Musculoskeletal: Negative.   Skin: Negative.   Neurological: Negative.   Psychiatric/Behavioral: Negative.     Blood pressure (!) 137/95, pulse 78, temperature 98.1 F (36.7 C), temperature source Oral, resp. rate 18, height 5\' 11"  (1.803 m), weight 108.9 kg, SpO2 98 %.Body mass index is 33.47 kg/m.  General Appearance: Casual  Eye Contact::  Good  Speech:  Clear and [id number]  Volume:  Normal  Mood:  Euthymic  Affect:  Congruent  Thought Process:  Goal Directed  Orientation:  Full (Time, Place, and Person)  Thought Content:  Logical  Suicidal Thoughts:  No  Homicidal Thoughts:  No  Memory:  Immediate;   Fair Recent;   Fair Remote;   Fair  Judgement:  Fair  Insight:  Fair  Psychomotor Activity:  Normal  Concentration:  Fair  Recall:  [employer] of Knowledge:Fair  Language: Fair  Akathisia:  No  Handed:  Right  AIMS (if indicated):     Assets:  Desire for Improvement Physical Health Resilience  Sleep:  Number of Hours: 6  Cognition: WNL  ADL's:  Intact   Mental Status Per Nursing Assessment::   On Admission:  NA  Demographic Factors:  Male, Caucasian and Living alone  Loss Factors: Loss of significant relationship  Historical Factors: NA  Risk Reduction Factors:   Sense of responsibility to family, Religious beliefs about death and Employed  Continued Clinical Symptoms:  Dysthymia  Cognitive  Features That Contribute To Risk:  None    Suicide Risk:  Minimal: No identifiable suicidal ideation.  Patients presenting with no risk factors but with morbid ruminations; may be classified as minimal risk based on the severity of the depressive symptoms  [place] Follow up on 10/04/2019.   Why: You have a telemed appointment scheduled with Pascal Lux on Thursday 10/04/19 at 8:00AM. Please contact the office if you have any questions. Thank You! Contact information: [address] [place] [address] [phone]           Plan Of Care/Follow-up recommendations:  Activity:  Activity as tolerated Diet:  Regular diet Other:  Follow-up with scheduled appointment as listed above at the [facility].  Alethia Berthold, MD 10/02/2019, 12:07 PM

## 2019-10-02 NOTE — Discharge Summary (Signed)
Physician Discharge Summary Note  Patient:  Andrew Tate is an 38 y.o., male MRN:  [id number] DOB:  12-30-1980 Patient phone:  [phone] (home)  Patient address:   [address] [place] [address],  Total Time spent with patient: 30 minutes  Date of Admission:  09/28/2019 Date of Discharge: October 02, 2019  Reason for Admission: Patient was admitted because of concern that he had become paranoid to a delusional level concerning his wife.  Principal Problem: Delusional disorder [facility]) Discharge Diagnoses: Principal Problem:   Delusional disorder ([place]) Active Problems:   Opiate abuse, continuous ([place])   Past Psychiatric History: Patient has a history of opiate abuse.  No previous hospitalizations and no history of suicide attempts.  No history of psychosis.  Past Medical History:  Past Medical History:  Diagnosis Date  . ADHD (attention deficit hyperactivity disorder)   . Anxiety     Past Surgical History:  Procedure Laterality Date  . SURGERY OF LIP  2006   spit gland removal   Family History:  Family History  Problem Relation Age of Onset  . Heart disease Mother   . Bipolar disorder Mother   . Kidney cancer Mother   . Asthma Brother    Family Psychiatric  History: See note above.  Patient has a family history of bipolar disorder Social History:  Social History   Substance and Sexual Activity  Alcohol Use Never  . Frequency: Never     Social History   Substance and Sexual Activity  Drug Use Yes  . Types: Marijuana    Social History   Socioeconomic History  . Marital status: Single    Spouse name: Not on file  . Number of children: Not on file  . Years of education: Not on file  . Highest education level: Not on file  Occupational History  . Not on file  Social Needs  . Financial resource strain: Not on file  . Food insecurity    Worry: Not on file    Inability: Not on file  . Transportation needs    Medical: Not on file    Non-medical: Not on  file  Tobacco Use  . Smoking status: Current Every Day Smoker    Packs/day: 0.50    Types: Cigarettes  . Smokeless tobacco: Never Used  Substance and Sexual Activity  . Alcohol use: Never    Frequency: Never  . Drug use: Yes    Types: Marijuana  . Sexual activity: Yes  Lifestyle  . Physical activity    Days per week: Not on file    Minutes per session: Not on file  . Stress: Not on file  Relationships  . Social Herbalist on phone: Not on file    Gets together: Not on file    Attends religious service: Not on file    Active member of club or organization: Not on file    Attends meetings of clubs or organizations: Not on file    Relationship status: Not on file  Other Topics Concern  . Not on file  Social History Narrative  . Not on file    Hospital Course: Patient admitted to the psychiatric unit.  15-minute checks used for safety.  Patient was engaged in individual and group therapy.  He was offered medication with the recommendation that it appeared that his paranoia was excessive.  Patient declined medication and did not take any standing psychiatric medicine during his hospital stay.  Instead he received only  PRN medicine for opiate withdrawal.  After a couple days the opiate withdrawal symptoms all resolved.  Patient at no time made any threatening statements not threatening assault violence suicide or homicide.  He continues to believe that his wife has been having multiple affairs but has not been acting out on this and when asked about plans for the future indicates that he is likely to not go back to living with her but does not appear to be driven to any inappropriate behavior.  Still unclear I think to me whether this is a delusional disorder or not.  Patient has been advised that if mood is worsening or if suicidal or homicidal thoughts emerge he needs to get help immediately.  He has been willing to agree to outpatient therapy and an appointment has been made.   At this point does not appear to meet commitment criteria and is calm and cooperative with treatment planning and will be discharged home.  Physical Findings: AIMS:  , ,  ,  ,    CIWA:  CIWA-Ar Total: 11 COWS:     Musculoskeletal: Strength & Muscle Tone: within normal limits Gait & Station: normal Patient leans: N/A  Psychiatric Specialty Exam: Physical Exam  Nursing note and vitals reviewed. Constitutional: He appears well-developed and well-nourished.  HENT:  Head: Normocephalic and atraumatic.  Eyes: Pupils are equal, round, and reactive to light. Conjunctivae are normal.  Neck: Normal range of motion.  Cardiovascular: Regular rhythm and normal heart sounds.  Respiratory: Effort normal. No respiratory distress.  GI: Soft.  Musculoskeletal: Normal range of motion.  Neurological: He is alert.  Skin: Skin is warm and dry.  Psychiatric: He has a normal mood and affect. His behavior is normal. Judgment and thought content normal.    Review of Systems  Constitutional: Negative.   HENT: Negative.   Eyes: Negative.   Respiratory: Negative.   Cardiovascular: Negative.   Gastrointestinal: Negative.   Musculoskeletal: Negative.   Skin: Negative.   Neurological: Negative.   Psychiatric/Behavioral: Negative.     Blood pressure (!) 137/95, pulse 78, temperature 98.1 F (36.7 C), temperature source Oral, resp. rate 18, height '5\' 11"'$  (1.803 m), weight 108.9 kg, SpO2 98 %.Body mass index is 33.47 kg/m.  General Appearance: Casual  Eye Contact:  Good  Speech:  Clear and Coherent  Volume:  Normal  Mood:  Euthymic  Affect:  Congruent  Thought Process:  Goal Directed  Orientation:  Full (Time, Place, and Person)  Thought Content:  Logical  Suicidal Thoughts:  No  Homicidal Thoughts:  No  Memory:  Immediate;   Fair Recent;   Fair Remote;   Fair  Judgement:  Fair  Insight:  Fair  Psychomotor Activity:  Normal  Concentration:  Concentration: Fair  Recall:  [employer] of  Knowledge:  Fair  Language:  Fair  Akathisia:  No  Handed:  Right  AIMS (if indicated):     Assets:  Desire for Improvement Housing Physical Health  ADL's:  Intact  Cognition:  WNL  Sleep:  Number of Hours: 6     Have you used any form of tobacco in the last 30 days? (Cigarettes, Smokeless Tobacco, Cigars, and/or Pipes): Yes  Has this patient used any form of tobacco in the last 30 days? (Cigarettes, Smokeless Tobacco, Cigars, and/or Pipes) Yes, Yes, A prescription for an FDA-approved tobacco cessation medication was offered at discharge and the patient refused  Blood Alcohol level:  Lab Results  Component Value Date  ETH <10 31/54/0086    Metabolic Disorder Labs:  No results found for: HGBA1C, MPG No results found for: PROLACTIN Lab Results  Component Value Date   CHOL 208 (H) 09/29/2019   TRIG 131 09/29/2019   HDL 38 (L) 09/29/2019   CHOLHDL 5.5 09/29/2019   VLDL 26 09/29/2019   LDLCALC 144 (H) 09/29/2019    See Psychiatric Specialty Exam and Suicide Risk Assessment completed by Attending Physician prior to discharge.  Discharge destination:  Home  Is patient on multiple antipsychotic therapies at discharge:  No   Has Patient had three or more failed trials of antipsychotic monotherapy by history:  No  Recommended Plan for Multiple Antipsychotic Therapies: NA  Discharge Instructions    Diet - low sodium heart healthy   Complete by: As directed    Increase activity slowly   Complete by: As directed      Allergies as of 10/02/2019   No Known Allergies     Medication List    STOP taking these medications   ibuprofen 200 MG tablet Commonly known as: ADVIL   predniSONE 20 MG tablet Commonly known as: [place] Follow up on 10/04/2019.   Why: You have a telemed appointment scheduled with Pascal Lux on Thursday 10/04/19 at 8:00AM. Please contact the office if you have any questions. Thank  You! Contact information: [address] [place] [address] [phone]           Follow-up recommendations:  Activity:  Activity as tolerated Diet:  Regular diet Other:  Avoid returning to opiate use.  Do not abuse substances.  Engage with outpatient treatment as recommended.  Comments: Patient has met with treatment team and is agreeable to plan.  Signed: Alethia Berthold, MD 10/02/2019, 12:10 PM

## 2019-10-02 NOTE — Plan of Care (Signed)
Patient compliant with procedures on the unit.   Problem: Education: Goal: Knowledge of [facility] Education information/materials will improve Outcome: Progressing

## 2019-10-02 NOTE — Progress Notes (Signed)
D - Patient was in his room upon arrival to the unit. Patient was pleasant during assessment denying SI/HI/AVH, pain and depression. Patient endorses anxiety stating, "I am anxious because I am leaving tomorrow." Patient given encouragement.   A - Patient compliant with medication administration per MD orders this evening. Patient given education. Patient given support and encouragement to be active in his treatment plan. Patient informed to let staff know if there are any issues or problems on the unit.   R - Patient being monitored Q 15 minutes for safety per unit protocol. Patient remains safe on the unit.

## 2019-10-02 NOTE — Progress Notes (Signed)
Recreation Therapy Notes    Date: 10/02/2019  Time: 9:30 am  Location: Craft Room  Behavioral response: Appropriate   Intervention Topic: Coping-Skills  Discussion/Intervention:  Group content on today was focused on coping skills. The group defined what coping skills are and when they can be used. Individuals described how they normally cope with thing and the coping skills they normally use. Patients expressed why it is important to cope with things and how not coping with things can affect you. The group participated in the intervention "My coping box" and made coping boxes while adding coping skills they could use in the future to the box. Clinical Observations/Feedback:  Patient came to group and identified his coping skills as listening to music and playing with his dog. He explained that coping skills are important, because it can help you find a better way to deal with things and not be impulsive.  Individual was social with peers and staff while participating in the intervention.   Andrew Tate LRT/CTRS         Seleta Hovland 10/02/2019 12:16 PM

## 2019-10-02 NOTE — Progress Notes (Signed)
Recreation Therapy Notes  INPATIENT RECREATION TR PLAN  Patient Details Name: Andrew Tate MRN: [id number] DOB: 02/27/1981 Today's Date: 10/02/2019  Rec Therapy Plan Is patient appropriate for Therapeutic Recreation?: Yes Treatment times per week: at least 3 Estimated Length of Stay: 5-7 days TR Treatment/Interventions: Group participation (Comment)  Discharge Criteria Pt will be discharged from therapy if:: Discharged Treatment plan/goals/alternatives discussed and agreed upon by:: Patient/family  Discharge Summary Short term goals set: Patient will identify 3 positive coping skills strategies to use post d/c within 5 recreation therapy group sessions Short term goals met: Complete Progress toward goals comments: Groups attended Which groups?: Coping skills, Leisure education Reason goals not met: N/A Therapeutic equipment acquired: N/A Reason patient discharged from therapy: Discharge from hospital Pt/family agrees with progress & goals achieved: Yes Date patient discharged from therapy: 10/02/19   Korrie Hofbauer 10/02/2019, 3:02 PM

## 2019-10-02 NOTE — Plan of Care (Signed)
  Problem: Coping Skills Goal: STG - Patient will identify 3 positive coping skills strategies to use post d/c within 5 recreation therapy group sessions Description: STG - Patient will identify 3 positive coping skills strategies to use post d/c within 5 recreation therapy group sessions Outcome: Completed/Met

## 2024-02-14 ENCOUNTER — Ambulatory Visit: Payer: BC Managed Care – PPO | Admitting: Nurse Practitioner

## 2024-02-14 ENCOUNTER — Telehealth: Payer: Self-pay

## 2024-02-14 NOTE — Telephone Encounter (Signed)
Ok for [facility] to review.  Unable to reach patient and no option to leave a message. Patient new patient appointment has been swtiched to a virtual appointment due to the inclement weather expected tomorrow. If he calls back but does not have a camera to access a virtual appointment please reschedule his appointment.

## 2024-02-15 ENCOUNTER — Telehealth: Payer: Self-pay

## 2024-02-15 ENCOUNTER — Encounter: Payer: Self-pay | Admitting: Pediatrics

## 2024-02-15 ENCOUNTER — Telehealth: Payer: BC Managed Care – PPO | Admitting: Pediatrics

## 2024-02-15 VITALS — Ht 72.0 in | Wt 286.0 lb

## 2024-02-15 DIAGNOSIS — Z1330 Encounter for screening examination for mental health and behavioral disorders, unspecified: Secondary | ICD-10-CM

## 2024-02-15 DIAGNOSIS — Z7689 Persons encountering health services in other specified circumstances: Secondary | ICD-10-CM | POA: Diagnosis not present

## 2024-02-15 DIAGNOSIS — F411 Generalized anxiety disorder: Secondary | ICD-10-CM | POA: Diagnosis not present

## 2024-02-15 MED ORDER — ESCITALOPRAM OXALATE 10 MG PO TABS: ORAL_TABLET | ORAL | 0 refills | Status: AC

## 2024-02-15 MED ORDER — HYDROXYZINE HCL 10 MG PO TABS: 10.0000 mg | ORAL_TABLET | Freq: Three times a day (TID) | ORAL | 0 refills | Status: AC | PRN

## 2024-02-15 NOTE — Progress Notes (Addendum)
Telehealth Visit - Establish care  I connected with  Angela Burke on 02/15/24 by a video enabled telemedicine application and verified that I am speaking with the correct person using two identifiers.   I discussed the limitations of evaluation and management by telemedicine. The patient expressed understanding and agreed to proceed.  Subjective:    Patient ID: Andrew Tate, male    DOB: [birth year], 43 y.o.   MRN: [id number]  HPI: Andrew Tate is a 43 y.o. male  Chief Complaint  Patient presents with   Establish Care   Anxiety    Discussed the use of AI scribe software for clinical note transcription with the patient, who gave verbal consent to proceed.  History of Present Illness   [name] is a 43 year old male who presents with chronic anxiety and sleep disturbances. He is accompanied by his wife, Kirt Boys.  He has experienced chronic anxiety for several years, characterized by constant worry about uncontrollable events affecting him or his family. He feels anxious whenever he experiences minor physical discomfort, fearing it may be something serious. He has not tried other medications for anxiety or depression since discontinuing Depakote due to side effects and financial constraints from lack of insurance.  He experiences occasional insomnia, with nights of very little sleep, yet feels full of energy the following day. He attributes this energy to his anxiety rather than any desire to be active. No manic symptoms such as increased productivity or excessive spending during these periods.  He has a family history of psychiatric conditions, particularly on his mother's side, where several relatives have been diagnosed with bipolar disorder and severe anxiety.  No recent panic attacks, although he has experienced them in the past.     Relevant past medical, surgical, family and social history reviewed and updated as indicated. Interim medical history since our last  visit reviewed. Allergies and medications reviewed and updated.  ROS per HPI unless specifically indicated above     Objective:    Ht 6' (1.829 m)   Wt 286 lb (129.7 kg)   BMI 38.79 kg/m   Wt Readings from Last 3 Encounters:  02/15/24 286 lb (129.7 kg)  09/28/19 240 lb (108.9 kg)  09/27/19 240 lb (108.9 kg)     Physical Exam Constitutional:      General: He is not in acute distress.    Appearance: He is normal weight. He is not ill-appearing.  Pulmonary:     Effort: Pulmonary effort is normal.  Neurological:     General: No focal deficit present.     Mental Status: He is alert. Mental status is at baseline.  Psychiatric:        Mood and Affect: Mood normal.        Behavior: Behavior normal.      LIMITED EXAM GIVEN VIDEO VISIT     02/15/2024    2:10 PM 09/13/2019    1:59 PM  GAD 7 : Generalized Anxiety Score  Nervous, Anxious, on Edge 3 3  Control/stop worrying 2 3  Worry too much - different things 2 3  Trouble relaxing 2 2  Restless 0 2  Easily annoyed or irritable 2 3  Afraid - awful might happen 3 3  Total GAD 7 Score 14 19  Anxiety Difficulty Somewhat difficult Very difficult      02/15/2024    2:08 PM 09/13/2019    1:59 PM  Depression screen PHQ 2/9  Decreased Interest 3 2  Down, Depressed, Hopeless  2 2  PHQ - 2 Score 5 4  Altered sleeping 1 3  Tired, decreased energy 2 0  Change in appetite 0 2  Feeling bad or failure about yourself  0 1  Trouble concentrating 2 3  Moving slowly or fidgety/restless 0 2  Suicidal thoughts 0 0  PHQ-9 Score 10 15  Difficult doing work/chores Somewhat difficult       Assessment & Plan:  Assessment & Plan   Generalized anxiety disorder Assessment & Plan: Chronic worry about uncontrollable events. Previous trial of Depakote with poor tolerance. Family history of anxiety and bipolar disorder, unsure about family members pharmacologic treatment. Sleep disturbance with increased energy during periods of sleep  deprivation. Unclear if true hypymania and/or h/o mania but patient denies. On chart review, there was concern for delusional disorder but never met full criteria or followed with psych. Reviewed s/s of mania and precautions for starting below treatment. Of note has h/o opioid use d/o will discuss further at in person follow up. -Start Lexapro 5mg  daily for 7 days, then increase to 10mg  daily. Monitor for worsening anxiety or sleep disturbance. -As needed use of Hydroxyzine, not to exceed three times daily. -Check in at 2-week mark to assess medication efficacy and tolerance.  Orders: -     Escitalopram Oxalate; Take 0.5 tablets (5 mg total) by mouth daily for 7 days, THEN 1 tablet (10 mg total) daily for 21 days.  Dispense: 24.5 tablet; Refill: 0 -     hydrOXYzine HCl; Take 1 tablet (10 mg total) by mouth 3 (three) times daily as needed for anxiety.  Dispense: 30 tablet; Refill: 0  Encounter to establish care Reviewed available patient record including history, medications, problem list. HM updated as able. Will review and/or request outside records (if applicable) and will fill remaining HM gaps as needed at follow up visit.   Encounter for behavioral health screening As part of their intake evaluation, the patient was screened for depression, anxiety.  PHQ9 SCORE 10, GAD7 SCORE 14. Screening results positive for tested conditions. See plan under problem/diagnosis above.   Follow up plan: Return in about 2 weeks (around 02/29/2024) for Mood.  Jackolyn Confer, MD   This visit was completed via video visit through MyChart due to the restrictions of the COVID-19 pandemic. All issues as above were discussed and addressed. Physical exam was done as above through visual confirmation on video through MyChart. If it was felt that the patient should be evaluated in the office, they were directed there. The patient verbally consented to this visit."} Location of the patient: home Location of the  provider: home Those involved with this call:  Provider: Modena Nunnery, MD CMA: Babs Bertin, CMA Time spent on call: 20 minutes with patient face to face via video conference. More than 50% of this time was spent in counseling and coordination of care. 25 minutes total spent in review of patient's record and preparation and closing of their chart, and development of treatment plan. Total time spent on this encounter: 45 minutes.

## 2024-02-15 NOTE — Telephone Encounter (Signed)
Returned call and left message again with next appointment that was changed due to request between patient and provider during his appointment this afternoon. Please review that and change if needed.

## 2024-02-15 NOTE — Telephone Encounter (Signed)
CCopied from CRM [id number]. Topic: General - Call Back - No Documentation >> Feb 15, 2024  2:44 PM Geroge Baseman wrote: Reason for CRM: Patient was calling in regard to missed call. Was not able to identify what the call was regarding. She stated it was for [place], from [place]. Please call back if there was a message to be relayed.

## 2024-02-15 NOTE — Assessment & Plan Note (Addendum)
Chronic worry about uncontrollable events. Previous trial of Depakote with poor tolerance. Family history of anxiety and bipolar disorder, unsure about family members pharmacologic treatment. Sleep disturbance with increased energy during periods of sleep deprivation. Unclear if true hypymania and/or h/o mania but patient denies. On chart review, there was concern for delusional disorder but never met full criteria or followed with psych. Reviewed s/s of mania and precautions for starting below treatment. Of note has h/o opioid use d/o will discuss further at in person follow up. -Start Lexapro 5mg  daily for 7 days, then increase to 10mg  daily. Monitor for worsening anxiety or sleep disturbance. -As needed use of Hydroxyzine, not to exceed three times daily. -Check in at 2-week mark to assess medication efficacy and tolerance.

## 2024-02-15 NOTE — Patient Instructions (Addendum)
Plan: - Start lexapro daily. You will need to take 1/2 tab for 7 days then the full tab daily. We will check in before the next refill is due in 2 weeks. Please pay attention to these main side effects: worsened anxiety, worsened insomnia, hyperactivation or mania (as we talked about during your visit). If you experience those, please stop the medication and call our office immediately.  - Since it takes a couple of weeks to see improvement on the lexapro, I sent hydroxyzine 10mg  tabs to use as needed for anxiety throughout the day. If you find that it makes you too sleepy, please cut it in half. Do not take more than 3 per day. Please let me know if you find yourself taking more than three per day. You can use this medication to sleep if it makes you feel more calm. Do not take more than 1 to sleep.   Good to meet you! Welcome to [facility]!  As your primary care doctor, I look forward to working with you to help you reach your health goals.  Please be aware of a couple of logistical items: - If you message me on mychart, it may take me 1-2 business days to get back to you. This is for non-urgent messaging.  - If you require urgent clinical attention, please call the clinic or present to urgent care/emergency room - If you have labs, I typically will send a message about them in 1-2 business days. - I am not here on Mondays, otherwise will be available from Tuesday-Friday during 8a-5pm.

## 2024-02-21 ENCOUNTER — Encounter: Payer: BC Managed Care – PPO | Admitting: Pediatrics

## 2024-02-29 ENCOUNTER — Encounter: Payer: BC Managed Care – PPO | Admitting: Pediatrics

## 2024-02-29 NOTE — Progress Notes (Deleted)
 There were no vitals taken for this visit.   Annual Physical Exam - Male  Subjective:   CC: No chief complaint on file.   Andrew Tate is a 43 y.o. male patient here for a preventative health maintenance exam{Blank Single :19197::" and has no acute complaints",". Additional topics discussed include:"}  Health Habits: DIET: {Desc; diets:16563} EXERCISE: *** times/week on average, activities include {misc; exercise types:16438} DENTAL EXAM: {mpsduenotdue:31082} EYE EXAM: {mpsduenotdue:31082}                              Sex History:  Sexually active : {CHL AMB SEXUALLY ACTIVE:210950101} Barrier protection : {barrier protection:48916}       Family History  Problem Relation Age of Onset   Bipolar disorder Mother    Kidney cancer Mother    Asthma Brother                       Social History   Tobacco Use   Smoking status: Every Day    Current packs/day: 0.50    Types: Cigarettes   Smokeless tobacco: Never  Vaping Use   Vaping status: Former   Substances: Nicotine, Flavoring  Substance Use Topics   Alcohol use: Never   Drug use: Yes    Types: Marijuana   Social History   Social History Narrative   Not on file    Social drivers questionnaire is reviewed and is positive for : {SDOH screen:50704}. Follow up: {sdoh follow up:67204::"None"} {Reminder- only displays positive results. Screens housing/financial/transportation/food. Other older screens may be included. F2 through for info and options. This will delete.:67203}  Depression Screening:     02/15/2024    2:08 PM 09/13/2019    1:59 PM  Depression screen PHQ 2/9  Decreased Interest 3 2  Down, Depressed, Hopeless 2 2  PHQ - 2 Score 5 4  Altered sleeping 1 3  Tired, decreased energy 2 0  Change in appetite 0 2  Feeling bad or failure about yourself  0 1  Trouble concentrating 2 3  Moving slowly or fidgety/restless 0 2  Suicidal thoughts 0 0  PHQ-9 Score 10 15  Difficult doing work/chores Somewhat  difficult         02/15/2024    2:10 PM 09/13/2019    1:59 PM  GAD 7 : Generalized Anxiety Score  Nervous, Anxious, on Edge 3 3  Control/stop worrying 2 3  Worry too much - different things 2 3  Trouble relaxing 2 2  Restless 0 2  Easily annoyed or irritable 2 3  Afraid - awful might happen 3 3  Total GAD 7 Score 14 19  Anxiety Difficulty Somewhat difficult Very difficult      Depression Severity and Treatment Recommendations:  0-4= None  5-9= Mild / Treatment: Support, educate to call if worse; return in one month  10-14= Moderate / Treatment: Support, watchful waiting; Antidepressant or Psychotherapy  15-19= Moderately severe / Treatment: Antidepressant OR Psychotherapy  >= 20= Major depression, severe / Antidepressant AND Psychotherapy  Mental Health Plan: {[phone]}  Health Maintenance Colon Cancer Screening : {mpsduenotdue:31082} Prostate Cancer Screening :  {CHL AMB PSA (Optional):27385} Immunizations : {Immunizations:5306}  Self Management Goals  Goals   None     Review of Systems Per HPI        Current Outpatient Medications (Other):    escitalopram (LEXAPRO) 10 MG tablet, Take 0.5 tablets (5 mg total) by mouth  daily for 7 days, THEN 1 tablet (10 mg total) daily for 21 days.   hydrOXYzine (ATARAX) 10 MG tablet, Take 1 tablet (10 mg total) by mouth 3 (three) times daily as needed for anxiety.   Patient Active Problem List   Diagnosis Date Noted   Generalized anxiety disorder 02/15/2024   History of opioid abuse (HCC) 09/28/2019   Aggressive behavior    Cigarette smoker 09/17/2019     Objective:   There were no vitals filed for this visit.  Physical Exam ***  Assessment and Plan:   Annual physical exam  Encounter for behavioral health screening  Generalized anxiety disorder    This plan was discussed with the patient and questions were answered. There were no further concerns.  Follow up as indicated, or sooner should any new  problems arise, if conditions worsen, or if they are otherwise concerned.   See patient instructions for additional information.  Jackolyn Confer, MD Family Medicine       Future Appointments  Date Time Provider Department Center  02/29/2024  4:00 PM Jackolyn Confer, MD [facility]

## 2024-07-12 DIAGNOSIS — T50904A Poisoning by unspecified drugs, medicaments and biological substances, undetermined, initial encounter: Secondary | ICD-10-CM | POA: Diagnosis not present

## 2024-07-12 DIAGNOSIS — I10 Essential (primary) hypertension: Secondary | ICD-10-CM | POA: Diagnosis not present

## 2024-07-12 DIAGNOSIS — T5191XA Toxic effect of unspecified alcohol, accidental (unintentional), initial encounter: Secondary | ICD-10-CM | POA: Diagnosis not present

## 2024-07-12 DIAGNOSIS — R0689 Other abnormalities of breathing: Secondary | ICD-10-CM | POA: Diagnosis not present

## 2024-07-12 DIAGNOSIS — T50901A Poisoning by unspecified drugs, medicaments and biological substances, accidental (unintentional), initial encounter: Secondary | ICD-10-CM | POA: Diagnosis not present

## 2024-07-12 DIAGNOSIS — T887XXA Unspecified adverse effect of drug or medicament, initial encounter: Secondary | ICD-10-CM | POA: Diagnosis not present

## 2024-07-12 DIAGNOSIS — T40411A Poisoning by fentanyl or fentanyl analogs, accidental (unintentional), initial encounter: Secondary | ICD-10-CM | POA: Diagnosis not present

## 2024-07-12 DIAGNOSIS — R231 Pallor: Secondary | ICD-10-CM | POA: Diagnosis not present
